# Patient Record
Sex: Female | Born: 1995 | Race: Black or African American | Hispanic: No | Marital: Single | Smoking: Never smoker
Health system: Southern US, Community
[De-identification: ages and names within clinical notes are randomized; demographics above are authoritative.]

## PROBLEM LIST (undated history)

## (undated) HISTORY — PX: KNEE SURGERY: SHX244

---
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
## 2018-06-19 ENCOUNTER — Ambulatory Visit (HOSPITAL_COMMUNITY)
Admission: EM | Admit: 2018-06-19 | Discharge: 2018-06-19 | Disposition: A | Discharge status: Home / Self Care | Payer: Self-pay | Attending: Internal Medicine | Admitting: Internal Medicine

## 2018-06-19 ENCOUNTER — Encounter (HOSPITAL_COMMUNITY): Payer: Self-pay

## 2018-06-19 DIAGNOSIS — H5789 Other specified disorders of eye and adnexa: Secondary | ICD-10-CM

## 2018-06-19 MED ORDER — TETRACAINE HCL 0.5 % OP SOLN
OPHTHALMIC | Status: AC
  Filled 2018-06-19: qty 4

## 2018-06-19 NOTE — ED Triage Notes (Signed)
Pt presents with irritation and pain with right eye since waking up this morning.

## 2018-06-19 NOTE — Discharge Instructions (Addendum)
No danger signs on exam today, vision check was 20/20.  Cause of redness/mild irritation may be something in the air, an irritant like an eyelash, or possibly an early viral infection.  Anticipate gradual improvement in eye redness/irritation over the next couple days.  Cool compresses for 5-10 minutes several times daily, or natural tears type eye drops, may relieve discomfort.  Go to ED if loss of vision or marked increase in eye pain occur.  Recheck or followup with an eye doctor if not improving as expected in a few days.  Note for work today.

## 2018-06-19 NOTE — ED Provider Notes (Signed)
[facility]    CSN: [id number] Arrival date & time: 06/19/18  1338     History   Chief Complaint Chief Complaint  Patient presents with  . Eye Problem    HPI Bonnie Reilly is a 23 y.o. female.   She woke up with some irritation in her right eye, particularly a little discomfort with blinking, this morning.  No mattering, no drainage.  No photophobia.  Eye has also been a little bit itchy.  Discomfort is not severe.  No runny/congested nose, no cough, no sore throat.  No headache.  Does not wear contacts.  No change in vision.  No sick contacts.  Did have an episode of emesis x1 this morning, no abdominal pain, no further emesis. No family history of eye problems reported.   HPI  History reviewed. No pertinent past medical history.   History reviewed. No pertinent surgical history.     Home Medications   Takes no meds regularly  Family History Family History  Problem Relation Age of Onset  . Healthy Mother   . Healthy Father     Social History Social History   Tobacco Use  . Smoking status: Never Smoker  . Smokeless tobacco: Never Used  Substance Use Topics  . Alcohol use: Not on file  . Drug use: Not on file     Allergies   Patient has no known allergies.   Review of Systems Review of Systems  All other systems reviewed and are negative.    Physical Exam Triage Vital Signs ED Triage Vitals  Enc Vitals Group     BP 06/19/18 1402 (!) 143/85     Pulse Rate 06/19/18 1402 73     Resp 06/19/18 1402 20     Temp 06/19/18 1402 98.1 F (36.7 C)     Temp Source 06/19/18 1402 Oral     SpO2 06/19/18 1402 96 %     Weight --      Height --      Pain Score 06/19/18 1403 5     Pain Loc --    Updated Vital Signs BP (!) 143/85 (BP Location: Right Arm)   Pulse 73   Temp 98.1 F (36.7 C) (Oral)   Resp 20   SpO2 96%   Right Eye Near: R Near: 20/20 without corrective lens Left Eye Near:  L Near: 20/20 without corrective lens Bilateral  Near:  20/20 without corrective lens  Physical Exam Vitals signs and nursing note reviewed.  Constitutional:      General: She is not in acute distress.    Comments: Alert, nicely groomed  HENT:     Head: Atraumatic.  Eyes:     Comments: Conjugate gaze, no eye drainage Mild conjunctival injection right lateral conjunctiva No corneal lesion appreciated on penlight or fluorescein exam  Neck:     Musculoskeletal: Neck supple.  Cardiovascular:     Rate and Rhythm: Normal rate.  Pulmonary:     Effort: No respiratory distress.  Abdominal:     General: There is no distension.  Musculoskeletal: Normal range of motion.     Comments: No leg swelling  Skin:    General: Skin is warm and dry.     Comments: No cyanosis  Neurological:     Mental Status: She is alert and oriented to person, place, and time.      Final Clinical Impressions(s) / UC Diagnoses   Final diagnoses:  Eye irritation     Discharge Instructions  No danger signs on exam today, vision check was 20/20.  Cause of redness/mild irritation may be something in the air, an irritant like an eyelash, or possibly an early viral infection.  Anticipate gradual improvement in eye redness/irritation over the next couple days.  Cool compresses for 5-10 minutes several times daily, or natural tears type eye drops, may relieve discomfort.  Go to ED if loss of vision or marked increase in eye pain occur.  Recheck or followup with an eye doctor if not improving as expected in a few days.  Note for work today.   ED Prescriptions    None       Isa Rankin, MD 06/25/18 2038

## 2018-11-04 ENCOUNTER — Other Ambulatory Visit: Payer: Self-pay

## 2018-11-04 ENCOUNTER — Encounter (HOSPITAL_COMMUNITY): Payer: Self-pay | Admitting: Emergency Medicine

## 2018-11-04 ENCOUNTER — Emergency Department (HOSPITAL_COMMUNITY)
Admission: EM | Admit: 2018-11-04 | Discharge: 2018-11-04 | Disposition: A | Discharge status: Home / Self Care | Payer: Managed Care, Other (non HMO) | Attending: Emergency Medicine | Admitting: Emergency Medicine

## 2018-11-04 ENCOUNTER — Emergency Department (HOSPITAL_COMMUNITY): Payer: Managed Care, Other (non HMO)

## 2018-11-04 DIAGNOSIS — M25511 Pain in right shoulder: Secondary | ICD-10-CM

## 2018-11-04 DIAGNOSIS — H1131 Conjunctival hemorrhage, right eye: Secondary | ICD-10-CM | POA: Insufficient documentation

## 2018-11-04 DIAGNOSIS — S01511A Laceration without foreign body of lip, initial encounter: Secondary | ICD-10-CM | POA: Diagnosis not present

## 2018-11-04 DIAGNOSIS — S161XXA Strain of muscle, fascia and tendon at neck level, initial encounter: Secondary | ICD-10-CM | POA: Diagnosis not present

## 2018-11-04 DIAGNOSIS — Y999 Unspecified external cause status: Secondary | ICD-10-CM | POA: Diagnosis not present

## 2018-11-04 DIAGNOSIS — Y9389 Activity, other specified: Secondary | ICD-10-CM | POA: Diagnosis not present

## 2018-11-04 DIAGNOSIS — Y92414 Local residential or business street as the place of occurrence of the external cause: Secondary | ICD-10-CM | POA: Insufficient documentation

## 2018-11-04 DIAGNOSIS — Z23 Encounter for immunization: Secondary | ICD-10-CM | POA: Diagnosis not present

## 2018-11-04 DIAGNOSIS — S4991XA Unspecified injury of right shoulder and upper arm, initial encounter: Secondary | ICD-10-CM | POA: Diagnosis present

## 2018-11-04 MED ORDER — TETANUS-DIPHTH-ACELL PERTUSSIS 5-2.5-18.5 LF-MCG/0.5 IM SUSP
0.5000 mL | Freq: Once | INTRAMUSCULAR | Status: AC
  Administered 2018-11-04: 0.5 mL via INTRAMUSCULAR
  Filled 2018-11-04: qty 0.5

## 2018-11-04 MED ORDER — LIDOCAINE VISCOUS HCL 2 % MT SOLN: 15.0000 mL | OROMUCOSAL | 0 refills | Status: DC | PRN

## 2018-11-04 MED ORDER — IBUPROFEN 400 MG PO TABS
600.0000 mg | ORAL_TABLET | Freq: Once | ORAL | Status: AC
  Administered 2018-11-04: 600 mg via ORAL
  Filled 2018-11-04: qty 1

## 2018-11-04 MED ORDER — METHOCARBAMOL 500 MG PO TABS
500.0000 mg | ORAL_TABLET | Freq: Once | ORAL | Status: AC
  Administered 2018-11-04: 500 mg via ORAL
  Filled 2018-11-04: qty 1

## 2018-11-04 MED ORDER — METHOCARBAMOL 500 MG PO TABS: 500.0000 mg | ORAL_TABLET | Freq: Two times a day (BID) | ORAL | 0 refills | Status: DC

## 2018-11-04 NOTE — ED Notes (Signed)
Patient verbalizes understanding of discharge instructions. Opportunity for questioning and answers were provided. Armband removed by staff, pt discharged from ED.  
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
## 2018-11-10 ENCOUNTER — Emergency Department (HOSPITAL_COMMUNITY)
Admission: EM | Admit: 2018-11-10 | Discharge: 2018-11-10 | Disposition: A | Discharge status: Home / Self Care | Payer: Managed Care, Other (non HMO) | Attending: Emergency Medicine | Admitting: Emergency Medicine

## 2018-11-10 ENCOUNTER — Other Ambulatory Visit: Payer: Self-pay

## 2018-11-10 DIAGNOSIS — G8911 Acute pain due to trauma: Secondary | ICD-10-CM | POA: Insufficient documentation

## 2018-11-10 DIAGNOSIS — M25511 Pain in right shoulder: Secondary | ICD-10-CM | POA: Diagnosis present
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
## 2018-11-10 NOTE — Discharge Instructions (Signed)
You can take Tylenol or Ibuprofen as directed for pain. You can alternate Tylenol and Ibuprofen every 4 hours. If you take Tylenol at 1pm, then you can take Ibuprofen at 5pm. Then you can take Tylenol again at 9pm.   Take Robaxin as prescribed. This medication will make you drowsy so do not drive or drink alcohol when taking it.  Apply ice to help with pain.  Follow-up with [facility] to establish a primary care doctor if you do not have one.   Return to the emergency department for any worsening pain, fevers, redness, swelling or any other worsening concerning symptoms.
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
## 2019-05-18 ENCOUNTER — Other Ambulatory Visit: Payer: Managed Care, Other (non HMO)

## 2020-03-13 ENCOUNTER — Encounter (HOSPITAL_COMMUNITY): Payer: Self-pay | Admitting: Urgent Care

## 2020-03-13 ENCOUNTER — Ambulatory Visit (HOSPITAL_COMMUNITY)
Admission: EM | Admit: 2020-03-13 | Discharge: 2020-03-13 | Disposition: A | Discharge status: Home / Self Care | Payer: HRSA Program | Attending: Urgent Care | Admitting: Urgent Care

## 2020-03-13 ENCOUNTER — Other Ambulatory Visit: Payer: Self-pay

## 2020-03-13 DIAGNOSIS — R0981 Nasal congestion: Secondary | ICD-10-CM | POA: Insufficient documentation

## 2020-03-13 DIAGNOSIS — R519 Headache, unspecified: Secondary | ICD-10-CM | POA: Diagnosis present

## 2020-03-13 DIAGNOSIS — U071 COVID-19: Secondary | ICD-10-CM | POA: Insufficient documentation

## 2020-03-13 NOTE — ED Provider Notes (Signed)
  [facility]   MRN: [id number] DOB: [birth year]  Subjective:   Bonnie Reilly is a 24 y.o. female presenting for 1 day history of frontal headache that lasted through the night and into the morning.  She woke up with sinus congestion as well.  Took Tylenol and this helped.  Denies fever, sinus pain, cough, shortness of breath, chest pain, body aches.  Patient is not Covid vaccinated.  Requires a note for work before returning.  Is scheduled to work again on Monday.  She is not currently taking any medications and has no known food or drug allergies.  Denies past medical and surgical history.   Family History  Problem Relation Age of Onset  . Healthy Mother   . Healthy Father     Social History   Tobacco Use  . Smoking status: Never Smoker  . Smokeless tobacco: Never Used  Substance Use Topics  . Alcohol use: Yes  . Drug use: Never    ROS   Objective:   Vitals: BP (!) 143/97 (BP Location: Right Arm)   Pulse 62   Temp 97.8 F (36.6 C)   LMP 02/25/2020 (Approximate)   SpO2 99%   Physical Exam Constitutional:      General: She is not in acute distress.    Appearance: Normal appearance. She is well-developed. She is not ill-appearing, toxic-appearing or diaphoretic.  HENT:     Head: Normocephalic and atraumatic.     Nose: Nose normal.     Mouth/Throat:     Mouth: Mucous membranes are moist.     Pharynx: Oropharynx is clear.  Eyes:     General: No scleral icterus.    Extraocular Movements: Extraocular movements intact.     Pupils: Pupils are equal, round, and reactive to light.  Cardiovascular:     Rate and Rhythm: Normal rate.  Pulmonary:     Effort: Pulmonary effort is normal.  Skin:    General: Skin is warm and dry.  Neurological:     General: No focal deficit present.     Mental Status: She is alert and oriented to person, place, and time.     Cranial Nerves: No cranial nerve deficit.     Motor: No weakness.     Coordination:  Romberg sign negative. Coordination normal.     Gait: Gait normal.     Deep Tendon Reflexes: Reflexes normal.  Psychiatric:        Mood and Affect: Mood normal. Mood is not anxious or depressed.        Speech: Speech normal.        Behavior: Behavior normal. Behavior is not agitated.        Cognition and Memory: Cognition is not impaired. Memory is not impaired.      Assessment and Plan :   PDMP not reviewed this encounter.  1. Acute nonintractable headache, unspecified headache type   2. Sinus congestion     Suspect patient had a sinus headache.  Given the congestion, will test for COVID-19.  Normal neurologic exam.  Recommend supportive care. Counseled patient on potential for adverse effects with medications prescribed/recommended today, ER and return-to-clinic precautions discussed, patient verbalized understanding.    Wallis Bamberg, [place] 03/13/20 1838

## 2020-03-13 NOTE — ED Triage Notes (Signed)
Pt states she had a headache last night and this morning. She states she called out of work and needs a work note. The headache subsided after taking tylenol.

## 2020-03-14 LAB — SARS CORONAVIRUS 2 (TAT 6-24 HRS): SARS Coronavirus 2: POSITIVE — AB

## 2020-03-15 ENCOUNTER — Telehealth: Payer: Self-pay | Admitting: Physician Assistant

## 2020-03-15 ENCOUNTER — Telehealth: Payer: Self-pay | Admitting: Unknown Physician Specialty

## 2020-03-15 ENCOUNTER — Telehealth: Payer: Self-pay | Admitting: Nurse Practitioner

## 2020-03-15 NOTE — Telephone Encounter (Signed)
Called to Discuss with patient about Covid symptoms and the use of the monoclonal antibody infusion for those with mild to moderate Covid symptoms and at a high risk of hospitalization.     Pt appears to qualify for this infusion due to co-morbid conditions and/or a member of an at-risk group in accordance with the FDA Emergency Use Authorization. Patient declines infusion, stating she feels much better since ED visit and no other symptoms. Advised if symptoms worsened or she wished to be considered for infusion to contact the infusion center. Pt verbalized understanding. Last eligible date is 03/22/20.   Bonnie Alma, NP [facility]  [phone]

## 2020-03-15 NOTE — Telephone Encounter (Signed)
Called to Discuss with patient about Covid symptoms and the use of the monoclonal antibody infusion for those with mild to moderate Covid symptoms and at a high risk of hospitalization.     Pt appears to qualify for this infusion due to co-morbid conditions and/or a member of an at-risk group in accordance with the FDA Emergency Use Authorization.    Unable to reach pt. Left voice mail and sent mychart message.   

## 2020-03-15 NOTE — Telephone Encounter (Signed)
Call #2- Called to Discuss with patient about Covid symptoms and the use of the monoclonal antibody infusion for those with mild to moderate Covid symptoms and at a high risk of hospitalization.     Pt appears to qualify for this infusion due to co-morbid conditions and/or a member of an at-risk group in accordance with the FDA Emergency Use Authorization.    Unable to reach pt  LMOM 

## 2020-03-19 ENCOUNTER — Other Ambulatory Visit: Payer: Self-pay

## 2020-03-19 ENCOUNTER — Emergency Department (HOSPITAL_COMMUNITY): Payer: HRSA Program

## 2020-03-19 ENCOUNTER — Encounter (HOSPITAL_COMMUNITY): Payer: Self-pay

## 2020-03-19 ENCOUNTER — Emergency Department (HOSPITAL_COMMUNITY)
Admission: EM | Admit: 2020-03-19 | Discharge: 2020-03-19 | Disposition: A | Discharge status: Home / Self Care | Payer: HRSA Program | Attending: Emergency Medicine | Admitting: Emergency Medicine

## 2020-03-19 DIAGNOSIS — U071 COVID-19: Secondary | ICD-10-CM | POA: Diagnosis not present

## 2020-03-19 DIAGNOSIS — J1282 Pneumonia due to coronavirus disease 2019: Secondary | ICD-10-CM | POA: Insufficient documentation

## 2020-03-19 DIAGNOSIS — R0602 Shortness of breath: Secondary | ICD-10-CM | POA: Diagnosis present

## 2020-03-19 NOTE — ED Provider Notes (Signed)
[facility]-EMERGENCY DEPT Provider Note   CSN: [id number] Arrival date & time: 03/19/20  1241     History Chief Complaint  Patient presents with  . Covid Positive  . Shortness of Breath    Bonnie Reilly is a 24 y.o. female.  She has no significant past medical history.  She said she had a cough and shortness of breath starting this weekend and tested positive for Covid on Monday.  She became more short of breath since last night, needing to rest in the shower or climbing a flight of stairs.  Cough productive of some white sputum.  Some headaches responsive to Tylenol.  No diarrhea.  She is not Covid vaccinated.  The history is provided by the patient.  Shortness of Breath Severity:  Moderate Onset quality:  Gradual Timing:  Intermittent Progression:  Unchanged Chronicity:  New Context: activity   Relieved by:  Rest Worsened by:  Activity and coughing Ineffective treatments:  None tried Associated symptoms: cough and headaches   Associated symptoms: no abdominal pain, no chest pain, no fever, no hemoptysis, no rash, no sore throat and no vomiting   Risk factors: no tobacco use        History reviewed. No pertinent past medical history.  There are no problems to display for this patient.   History reviewed. No pertinent surgical history.   OB History   No obstetric history on file.     Family History  Problem Relation Age of Onset  . Healthy Mother   . Healthy Father     Social History   Tobacco Use  . Smoking status: Never Smoker  . Smokeless tobacco: Never Used  Substance Use Topics  . Alcohol use: Yes  . Drug use: Never    Home Medications Prior to Admission medications   Medication Sig Start Date End Date Taking? Authorizing Provider  lidocaine (XYLOCAINE) 2 % solution Use as directed 15 mLs in the mouth or throat every 3 (three) hours as needed for mouth pain. 11/04/18   Debord, Mia A, PA-C  methocarbamol (ROBAXIN) 500  MG tablet Take 1 tablet (500 mg total) by mouth 2 (two) times daily. 11/04/18   Baisch, Mia A, PA-C    Allergies    Patient has no known allergies.  Review of Systems   Review of Systems  Constitutional: Negative for fever.  HENT: Negative for sore throat.   Eyes: Negative for visual disturbance.  Respiratory: Positive for cough and shortness of breath. Negative for hemoptysis.   Cardiovascular: Negative for chest pain.  Gastrointestinal: Negative for abdominal pain and vomiting.  Genitourinary: Negative for dysuria.  Musculoskeletal: Positive for myalgias.  Skin: Negative for rash.  Neurological: Positive for headaches.    Physical Exam Updated Vital Signs BP 107/79   Pulse 61   Temp 98.1 F (36.7 C) (Oral)   Resp 16   LMP 02/25/2020 (Approximate)   SpO2 99%   Physical Exam Vitals and nursing note reviewed.  Constitutional:      General: She is not in acute distress.    Appearance: She is well-developed.  HENT:     Head: Normocephalic and atraumatic.  Eyes:     Conjunctiva/sclera: Conjunctivae normal.  Cardiovascular:     Rate and Rhythm: Normal rate and regular rhythm.     Heart sounds: No murmur heard.   Pulmonary:     Effort: Pulmonary effort is normal. No respiratory distress.     Breath sounds: Normal breath sounds.  Abdominal:  Palpations: Abdomen is soft.     Tenderness: There is no abdominal tenderness.  Musculoskeletal:        General: Normal range of motion.     Cervical back: Neck supple.     Right lower leg: No tenderness.     Left lower leg: No tenderness.  Skin:    General: Skin is warm and dry.     Capillary Refill: Capillary refill takes less than 2 seconds.  Neurological:     General: No focal deficit present.     Mental Status: She is alert.     ED Results / Procedures / Treatments   Labs (all labs ordered are listed, but only abnormal results are displayed) Labs Reviewed - No data to display  EKG EKG  Interpretation  Date/Time:  Wednesday March 19 2020 12:57:35 EST Ventricular Rate:  83 PR Interval:    QRS Duration: 80 QT Interval:  373 QTC Calculation: 439 R Axis:   60 Text Interpretation: Sinus rhythm 12 Lead; Mason-Likar No old tracing to compare Confirmed by Meridee Score [phone]) on 03/19/2020 3:40:09 PM   Radiology DG Chest Portable 1 View  Result Date: 03/19/2020 CLINICAL DATA:  Shortness of breath.  History of COVID-19 infection. EXAM: PORTABLE CHEST 1 VIEW COMPARISON:  None. FINDINGS: Single view of the chest demonstrates low lung volumes. Hazy densities in the right lower lung. Heart size is within normal limits. Trachea is midline. Negative for a pneumothorax. IMPRESSION: Low lung volumes with hazy densities in the right lower lung. Differential would include atelectasis versus atypical infection such as COVID pneumonia. Electronically Signed   By: Richarda Overlie M.D.   On: 03/19/2020 13:30    Procedures Procedures (including critical care time)  Medications Ordered in ED Medications - No data to display  ED Course  I have reviewed the triage vital signs and the nursing notes.  Pertinent labs & imaging results that were available during my care of the patient were reviewed by me and considered in my medical decision making (see chart for details).    MDM Rules/Calculators/A&P                         ARRION BURRUEL was evaluated in Emergency Department on 03/19/2020 for the symptoms described in the history of present illness. She was evaluated in the context of the global COVID-19 pandemic, which necessitated consideration that the patient might be at risk for infection with the SARS-CoV-2 virus that causes COVID-19. Institutional protocols and algorithms that pertain to the evaluation of patients at risk for COVID-19 are in a state of rapid change based on information released by regulatory bodies including the CDC and federal and state organizations. These  policies and algorithms were followed during the patient's care in the ED.  Patient here with Covid and increased shortness of breath.  Chest x-ray ordered interpreted by me as multifocal pneumonia consistent with viral process including Covid.  EKG unremarkable.  Patient is trending pulse ox remains mid to high 90s.  She does not require acute admission to the hospital.  I reached out to the infusion center and they will reach out to the patient for follow-up return instructions discussed.  Differential includes Covid, pneumonia, pneumothorax, PE    Final Clinical Impression(s) / ED Diagnoses Final diagnoses:  Pneumonia due to COVID-19 virus    Rx / DC Orders ED Discharge Orders    None       Terrilee Files, MD  03/19/20 1859  

## 2020-03-19 NOTE — Discharge Instructions (Addendum)
You were seen in the emergency department for worsening shortness of breath in the setting of a recent Covid diagnosis.  Your x-ray shows signs of Covid pneumonia.  Fortunately your oxygen number has been good.  The infusion center will reach out to you if you are a candidate for monoclonal antibodies.  There is also a Covid clinic that you can follow-up then if you feel your symptoms are worsening.  If you feel extremely short of breath or any other concerning symptoms please return to the emergency department.  You will need to isolate for up to 14 days from when your symptoms started.

## 2020-03-20 ENCOUNTER — Telehealth: Payer: Self-pay | Admitting: Nurse Practitioner

## 2020-03-20 NOTE — Telephone Encounter (Signed)
Called to Discuss with patient about Covid symptoms and the use of the monoclonal antibody infusion for those with mild to moderate Covid symptoms and at a high risk of hospitalization.     Pt appears to qualify for this infusion due to co-morbid conditions and/or a member of an at-risk group in accordance with the FDA Emergency Use Authorization.   Patient declined infusion last week however, it appears that symptoms have worsened. Unable to reach. Voicemail left and voicemail sent.   Willette Alma, NP [facility]  [phone]

## 2020-04-09 ENCOUNTER — Ambulatory Visit (HOSPITAL_COMMUNITY)
Admission: EM | Admit: 2020-04-09 | Discharge: 2020-04-09 | Disposition: A | Discharge status: Home / Self Care | Payer: Self-pay | Attending: Student | Admitting: Student

## 2020-04-09 ENCOUNTER — Ambulatory Visit (INDEPENDENT_AMBULATORY_CARE_PROVIDER_SITE_OTHER): Payer: Self-pay

## 2020-04-09 ENCOUNTER — Other Ambulatory Visit: Payer: Self-pay

## 2020-04-09 ENCOUNTER — Encounter (HOSPITAL_COMMUNITY): Payer: Self-pay | Admitting: *Deleted

## 2020-04-09 DIAGNOSIS — M25561 Pain in right knee: Secondary | ICD-10-CM

## 2020-04-09 NOTE — ED Triage Notes (Signed)
Patient reports right knee pain since Sunday night. Reports history of knee surgery to left knee. No injury on Sunday to knee, states that she had hurt it a couple months ago but not recently.   Reports pain with ambulation and extension.

## 2020-04-09 NOTE — ED Provider Notes (Addendum)
[facility]    CSN: [id number] Arrival date & time: 04/09/20  2683      History   Chief Complaint Chief Complaint  Patient presents with  . Knee Pain    HPI Bonnie Reilly is a 24 y.o. female presenting for right knee pain for 3 days. Denies injury at that time, though she did fall on the knee several months ago. States that the knee improved on its own, but she's been working a lot lately, involving lots of standing and walking; this seems to have aggravated the right knee. She describes the pain as a sharp pain on the front lower part of the knee. Patient has noticed the knee swelling, which alarmed her. She is able to ambulate with pain. Denies other symptoms, denies fevers/chills. Denies hip pain. Denies pain in either hip. She notes distant history of left knee surgery when she was about 11; she cannot remember what this was for.   HPI   History reviewed. No pertinent past medical history.  There are no problems to display for this patient.   History reviewed. No pertinent surgical history.  OB History   No obstetric history on file.      Home Medications    Prior to Admission medications   Medication Sig Start Date End Date Taking? Authorizing Provider  lidocaine (XYLOCAINE) 2 % solution Use as directed 15 mLs in the mouth or throat every 3 (three) hours as needed for mouth pain. 11/04/18   Beddow, Mia A, PA-C  methocarbamol (ROBAXIN) 500 MG tablet Take 1 tablet (500 mg total) by mouth 2 (two) times daily. 11/04/18   Smaltz, Mia A, PA-C    Family History Family History  Problem Relation Age of Onset  . Healthy Mother   . Healthy Father     Social History Social History   Tobacco Use  . Smoking status: Never Smoker  . Smokeless tobacco: Never Used  Substance Use Topics  . Alcohol use: Yes  . Drug use: Never     Allergies   Patient has no known allergies.   Review of Systems Review of Systems  Constitutional: Positive for activity  change (standing a lot). Negative for chills and fever.  Musculoskeletal: Positive for joint swelling (right knee pain ). Negative for arthralgias and back pain.  All other systems reviewed and are negative.    Physical Exam Triage Vital Signs ED Triage Vitals  Enc Vitals Group     BP 04/09/20 1047 138/85     Pulse Rate 04/09/20 1047 70     Resp 04/09/20 1047 16     Temp 04/09/20 1047 98 F (36.7 C)     Temp Source 04/09/20 1047 Oral     SpO2 04/09/20 1047 97 %     Weight --      Height --      Head Circumference --      Peak Flow --      Pain Score 04/09/20 1046 6     Pain Loc --      Pain Edu? --      Excl. in GC? --    No data found.  Updated Vital Signs BP 138/85 (BP Location: Left Arm)   Pulse 70   Temp 98 F (36.7 C) (Oral)   Resp 16   SpO2 97%   Visual Acuity Right Eye Distance:   Left Eye Distance:   Bilateral Distance:    Right Eye Near:   Left Eye Near:  Bilateral Near:     Physical Exam Vitals reviewed.  Constitutional:      General: She is not in acute distress.    Appearance: She is obese.  HENT:     Head: Normocephalic and atraumatic.  Cardiovascular:     Pulses: Normal pulses.  Musculoskeletal:        General: Swelling (right knee effusion ) and tenderness (right knee tender to palpation on distal medial aspect of patella) present. No deformity or signs of injury. Normal range of motion.     Right hip: Normal. No deformity, tenderness or bony tenderness. Normal range of motion. Normal strength.     Left hip: Normal. No deformity, tenderness or bony tenderness. Normal range of motion. Normal strength.     Right knee: Swelling and effusion present. No deformity, erythema, ecchymosis, lacerations or crepitus. Normal range of motion. Tenderness present. Normal pulse.     Instability Tests: Anterior drawer test negative. Posterior drawer test negative. Medial McMurray test negative and lateral McMurray test negative.     Left knee: Normal. No  swelling, deformity, effusion, erythema, ecchymosis, lacerations or crepitus. Normal range of motion. No tenderness. Normal pulse.     Instability Tests: Anterior drawer test negative. Posterior drawer test negative. Medial McMurray test negative and lateral McMurray test negative.     Right lower leg: No edema.     Left lower leg: No edema.     Comments: Left knee with faint surgical scar.   Gait normal but patient favors right leg.   Neurological:     Mental Status: She is alert.     Comments: LEs are neurovascularly intact      UC Treatments / Results  Labs (all labs ordered are listed, but only abnormal results are displayed) Labs Reviewed - No data to display  EKG   Radiology DG Knee AP/LAT W/Sunrise Right  Result Date: 04/09/2020 CLINICAL DATA:  Right knee pain EXAM: RIGHT KNEE 3 VIEWS COMPARISON:  None. FINDINGS: No acute bony abnormality. Specifically, no fracture, subluxation, or dislocation. Joint spaces maintained. No joint effusion. IMPRESSION: Negative. Electronically Signed   By: Charlett Nose M.D.   On: 04/09/2020 11:46    Procedures Procedures (including critical care time)  Medications Ordered in UC Medications - No data to display  Initial Impression / Assessment and Plan / UC Course  I have reviewed the triage vital signs and the nursing notes.  Pertinent labs & imaging results that were available during my care of the patient were reviewed by me and considered in my medical decision making (see chart for details).  Clinical Course as of Apr 09 1158  Wed Apr 09, 2020  1126 DG Knee AP/LAT W/Sunrise Right [LG]    Clinical Course User Index [LG] Rhys Martini, PA-C   Right knee Xray negative today. Reassurance provided. Rec NSAIDs for 4-6 weeks, and limit extensive standing/walking at work as possible. Follow-up with sports medicine if no improvement. Information provided. Patient is in agreement with the above plan.    Final Clinical Impressions(s) /  UC Diagnoses   Final diagnoses:  Acute pain of right knee   Discharge Instructions   None    ED Prescriptions    None     PDMP not reviewed this encounter.   Rhys Martini, PA-C 04/09/20 1200    Rhys Martini, PA-C 04/09/20 1217

## 2020-06-25 ENCOUNTER — Encounter (HOSPITAL_COMMUNITY): Payer: Self-pay

## 2020-06-25 ENCOUNTER — Ambulatory Visit (HOSPITAL_COMMUNITY)
Admission: RE | Admit: 2020-06-25 | Discharge: 2020-06-25 | Disposition: A | Discharge status: Home / Self Care | Payer: Medicaid Other | Source: Ambulatory Visit | Attending: Family Medicine | Admitting: Family Medicine

## 2020-06-25 ENCOUNTER — Other Ambulatory Visit: Payer: Self-pay

## 2020-06-25 VITALS — BP 134/88 | HR 82 | Temp 98.1°F | Resp 16

## 2020-06-25 DIAGNOSIS — J029 Acute pharyngitis, unspecified: Secondary | ICD-10-CM | POA: Insufficient documentation

## 2020-06-25 LAB — POCT RAPID STREP A, ED / UC: Streptococcus, Group A Screen (Direct): NEGATIVE

## 2020-06-25 NOTE — ED Triage Notes (Signed)
Pt presents with a sore throat X 2 days. Pt denies fever.

## 2020-06-25 NOTE — Discharge Instructions (Addendum)
You may use over the counter ibuprofen or acetaminophen as needed.  For a sore throat, over the counter products such as Colgate Peroxyl Mouth Sore Rinse or Chloraseptic Sore Throat Spray may provide some temporary relief. Your rapid strep test was negative today. We have sent your throat swab for culture and will let you know of any positive results. 

## 2020-06-25 NOTE — ED Provider Notes (Signed)
  [facility]   [id number] 06/25/20 Arrival Time: 1306  ASSESSMENT & PLAN:  1. Sore throat     No signs of peritonsillar abscess. Discussed. Throat culture sent. Declines COVID testing.  Results for orders placed or performed during the hospital encounter of 06/25/20  POCT Rapid Strep A  Result Value Ref Range   Streptococcus, Group A Screen (Direct) NEGATIVE NEGATIVE   Labs Reviewed  CULTURE, GROUP A STREP [facility])  POCT RAPID STREP A, ED / UC    OTC analgesics and throat care as needed   Discharge Instructions      You may use over the counter ibuprofen or acetaminophen as needed.   For a sore throat, over the counter products such as [employer]gate Peroxyl Mouth Sore Rinse or Chloraseptic Sore Throat Spray may provide some temporary relief.  Your rapid strep test was negative today. We have sent your throat swab for culture and will let you know of any positive results.     Reviewed expectations re: course of current medical issues. Questions answered. Outlined signs and symptoms indicating need for more acute intervention. Patient verbalized understanding. After Visit Summary given.   SUBJECTIVE:  Bonnie Reilly is a 25 y.o. female who reports a sore throat. First noted approx 2 d ago; stable. Painful swallowing. Afebrile. No resp symptoms. No OTC tx.   OBJECTIVE:  Vitals:   06/25/20 1340  BP: 134/88  Pulse: 82  Resp: 16  Temp: 98.1 F (36.7 C)  TempSrc: Oral  SpO2: 100%     General appearance: alert; no distress HEENT: throat with moderate erythema and cobblestoning; uvula is midline Neck: supple with FROM; no lymphadenopathy Lungs: speaks full sentences without difficulty; unlabored Abd: soft; non-tender Skin: reveals no rash; warm and dry Psychological: alert and cooperative; normal mood and affect  No Known Allergies  History reviewed. No pertinent past medical history. Social History   Socioeconomic History  . Marital status:  Single    Spouse name: Not on file  . Number of children: Not on file  . Years of education: Not on file  . Highest education level: Not on file  Occupational History  . Not on file  Tobacco Use  . Smoking status: Never Smoker  . Smokeless tobacco: Never Used  Substance and Sexual Activity  . Alcohol use: Yes  . Drug use: Never  . Sexual activity: Not on file  Other Topics Concern  . Not on file  Social History Narrative  . Not on file   Social Determinants of Health   Financial Resource Strain: Not on file  Food Insecurity: Not on file  Transportation Needs: Not on file  Physical Activity: Not on file  Stress: Not on file  Social Connections: Not on file  Intimate Partner Violence: Not on file   Family History  Problem Relation Age of Onset  . Healthy Mother   . Healthy Father           Mardella Layman, MD 06/25/20 [phone]

## 2020-06-26 LAB — CULTURE, GROUP A STREP (THRC)

## 2020-06-27 LAB — CULTURE, GROUP A STREP (THRC)

## 2020-07-07 ENCOUNTER — Other Ambulatory Visit: Payer: Self-pay

## 2020-07-07 ENCOUNTER — Encounter (HOSPITAL_COMMUNITY): Payer: Self-pay | Admitting: *Deleted

## 2020-07-07 ENCOUNTER — Ambulatory Visit (HOSPITAL_COMMUNITY)
Admission: EM | Admit: 2020-07-07 | Discharge: 2020-07-07 | Disposition: A | Discharge status: Home / Self Care | Payer: Medicaid Other | Attending: Emergency Medicine | Admitting: Emergency Medicine

## 2020-07-07 DIAGNOSIS — R112 Nausea with vomiting, unspecified: Secondary | ICD-10-CM

## 2020-07-07 DIAGNOSIS — R197 Diarrhea, unspecified: Secondary | ICD-10-CM

## 2020-07-07 MED ORDER — ONDANSETRON 4 MG PO TBDP: 4.0000 mg | ORAL_TABLET | Freq: Once | ORAL | Status: DC

## 2020-07-07 MED ORDER — ONDANSETRON 4 MG PO TBDP: 4.0000 mg | ORAL_TABLET | Freq: Three times a day (TID) | ORAL | 0 refills | Status: DC | PRN

## 2020-07-07 NOTE — Discharge Instructions (Signed)
Zofran every 8 hours as needed for nausea or vomiting.   Small frequent sips of fluids- Pedialyte, Gatorade, water, broth- to maintain hydration.   Advance to bland diet as tolerated.  If symptoms worsen or do not improve in the next week to return to be seen or to follow up with your PCP.

## 2020-07-07 NOTE — ED Triage Notes (Signed)
Pt presents with vomiting undigested food x2 today. No other Sx's.

## 2020-07-07 NOTE — ED Provider Notes (Signed)
[facility]    CSN: [id number] Arrival date & time: 07/07/20  1554      History   Chief Complaint Chief Complaint  Patient presents with  . Emesis    HPI Bonnie Reilly is a 25 y.o. female.   Bonnie Reilly presents with complaints of two episodes of vomiting, as well as diarrhea. She ate burgers she grilled, yesterday.  A few hours later noted nausea, followed by vomiting. She vomited once last night and once this morning. Still with some nausea. Abdominal pain prior to emesis or diarrhea, but otherwise no abdominal pain. No fevers. No other URI symptoms. No known ill contacts. She did have covid-19 in November of 2021. Normal urination. She is not sexually active. LMP 06/19/2020   ROS per HPI, negative if not otherwise mentioned.      History reviewed. No pertinent past medical history.  There are no problems to display for this patient.   History reviewed. No pertinent surgical history.  OB History   No obstetric history on file.      Home Medications    Prior to Admission medications   Medication Sig Start Date End Date Taking? Authorizing Provider  ondansetron (ZOFRAN-ODT) 4 MG disintegrating tablet Take 1 tablet (4 mg total) by mouth every 8 (eight) hours as needed for nausea or vomiting. 07/07/20  Yes Asiya Cutbirth, Dorene Grebe B, NP  lidocaine (XYLOCAINE) 2 % solution Use as directed 15 mLs in the mouth or throat every 3 (three) hours as needed for mouth pain. 11/04/18   Witting, Mia A, PA-C  methocarbamol (ROBAXIN) 500 MG tablet Take 1 tablet (500 mg total) by mouth 2 (two) times daily. 11/04/18   Burmester, Mia A, PA-C    Family History Family History  Problem Relation Age of Onset  . Healthy Mother   . Healthy Father     Social History Social History   Tobacco Use  . Smoking status: Never Smoker  . Smokeless tobacco: Never Used  Substance Use Topics  . Alcohol use: Yes  . Drug use: Never     Allergies   Patient has no known  allergies.   Review of Systems Review of Systems   Physical Exam Triage Vital Signs ED Triage Vitals  Enc Vitals Group     BP 07/07/20 1624 (!) 149/106     Pulse Rate 07/07/20 1624 96     Resp 07/07/20 1624 20     Temp 07/07/20 1624 98.3 F (36.8 C)     Temp Source 07/07/20 1624 Oral     SpO2 07/07/20 1624 99 %     Weight --      Height --      Head Circumference --      Peak Flow --      Pain Score 07/07/20 1622 0     Pain Loc --      Pain Edu? --      Excl. in GC? --    No data found.  Updated Vital Signs BP (!) 149/106 (BP Location: Right Arm)   Pulse 96   Temp 98.3 F (36.8 C) (Oral)   Resp 20   LMP 06/09/2020   SpO2 99%   Visual Acuity Right Eye Distance:   Left Eye Distance:   Bilateral Distance:    Right Eye Near:   Left Eye Near:    Bilateral Near:     Physical Exam Constitutional:      General: She is not in acute distress.  Appearance: She is well-developed.  Cardiovascular:     Rate and Rhythm: Normal rate.  Pulmonary:     Effort: Pulmonary effort is normal.  Abdominal:     Tenderness: There is no abdominal tenderness.  Skin:    General: Skin is warm and dry.  Neurological:     Mental Status: She is alert and oriented to person, place, and time.      UC Treatments / Results  Labs (all labs ordered are listed, but only abnormal results are displayed) Labs Reviewed  SARS CORONAVIRUS 2 (TAT 6-24 HRS)    EKG   Radiology No results found.  Procedures Procedures (including critical care time)  Medications Ordered in UC Medications  ondansetron (ZOFRAN-ODT) disintegrating tablet 4 mg (has no administration in time range)    Initial Impression / Assessment and Plan / UC Course  I have reviewed the triage vital signs and the nursing notes.  Pertinent labs & imaging results that were available during my care of the patient were reviewed by me and considered in my medical decision making (see chart for details).     Likely  related to the burgers she grilled last night. Supportive cares recommended. covid testing also collected prior to return to work. Return precautions provided. Patient verbalized understanding and agreeable to plan.   Final Clinical Impressions(s) / UC Diagnoses   Final diagnoses:  Nausea vomiting and diarrhea     Discharge Instructions     Zofran every 8 hours as needed for nausea or vomiting.   Small frequent sips of fluids- Pedialyte, Gatorade, water, broth- to maintain hydration.   Advance to bland diet as tolerated.  If symptoms worsen or do not improve in the next week to return to be seen or to follow up with your PCP.     ED Prescriptions    Medication Sig Dispense Auth. Provider   ondansetron (ZOFRAN-ODT) 4 MG disintegrating tablet Take 1 tablet (4 mg total) by mouth every 8 (eight) hours as needed for nausea or vomiting. 12 tablet Georgetta Haber, NP     PDMP not reviewed this encounter.   Georgetta Haber, NP 07/07/20 1719

## 2020-12-09 ENCOUNTER — Other Ambulatory Visit: Payer: Self-pay

## 2020-12-09 ENCOUNTER — Encounter (HOSPITAL_COMMUNITY): Payer: Self-pay

## 2020-12-09 ENCOUNTER — Ambulatory Visit (HOSPITAL_COMMUNITY)
Admission: EM | Admit: 2020-12-09 | Discharge: 2020-12-09 | Disposition: A | Discharge status: Home / Self Care | Payer: BC Managed Care – PPO | Attending: Family Medicine | Admitting: Family Medicine

## 2020-12-09 DIAGNOSIS — M25561 Pain in right knee: Secondary | ICD-10-CM

## 2020-12-09 DIAGNOSIS — G8929 Other chronic pain: Secondary | ICD-10-CM

## 2020-12-09 MED ORDER — MELOXICAM 15 MG PO TABS: 15.0000 mg | ORAL_TABLET | Freq: Every day | ORAL | 0 refills | Status: DC

## 2020-12-09 NOTE — Discharge Instructions (Addendum)
Your knee exam is most consistent with an injury to your right medial meniscus.  We will refer you to orthopedics for further evaluation.  You should work on strengthening of your quad muscles as I showed you, rest, ice the area, and I will also send you a prescription for an anti-inflammatory medication that you can use for pain.  You should not take this with any other anti-inflammatories such as Advil, Aleve, ibuprofen.  You can continue to wear your brace at work.  We will write you for light duty for 1 week.

## 2020-12-09 NOTE — ED Provider Notes (Signed)
[facility]    CSN: [id number] Arrival date & time: 12/09/20  1530      History   Chief Complaint Chief Complaint  Patient presents with   Knee Pain    HPI Bonnie Reilly is a 25 y.o. female.   Right Medial Knee pain Patient reports that for the last year she has been having right medial knee pain off and on She reports that this episode has been occurring for the last week This episode started suddenly when going from sitting to standing Started 1 year ago after a fall onto her right knee She was previously evaluated for this and last had imaging in urgent care on 04/10/2019 one of her right knee was unremarkable She has not tried formal physical therapy or home exercises, she has gotten an over-the-counter brace and wears at work She states that she works at [employer] and has pain after standing on her feet for a long period of time She also reports occasionally taking Tylenol or ibuprofen as needed for the pain, but does not improve She reports that going down stairs makes the pain more She does feel that her knee is unstable at times, but has never completely giving out causing her to fall She does feel some locking at times as well Occasionally has some swelling of her knee Otherwise feeling well, no fevers, no hip pain   History reviewed. No pertinent past medical history.  There are no problems to display for this patient.   History reviewed. No pertinent surgical history.  OB History   No obstetric history on file.      Home Medications    Prior to Admission medications   Medication Sig Start Date End Date Taking? Authorizing Provider  meloxicam (MOBIC) 15 MG tablet Take 1 tablet (15 mg total) by mouth daily. 12/09/20  Yes Harsha Yusko, Solmon Ice, DO  lidocaine (XYLOCAINE) 2 % solution Use as directed 15 mLs in the mouth or throat every 3 (three) hours as needed for mouth pain. 11/04/18   Kong, Mia A, PA-C  methocarbamol (ROBAXIN) 500 MG  tablet Take 1 tablet (500 mg total) by mouth 2 (two) times daily. 11/04/18   Behe, Mia A, PA-C  ondansetron (ZOFRAN-ODT) 4 MG disintegrating tablet Take 1 tablet (4 mg total) by mouth every 8 (eight) hours as needed for nausea or vomiting. 07/07/20   Georgetta Haber, NP    Family History Family History  Problem Relation Age of Onset   Healthy Mother    Healthy Father     Social History Social History   Tobacco Use   Smoking status: Never   Smokeless tobacco: Never  Substance Use Topics   Alcohol use: Yes   Drug use: Never     Allergies   Patient has no known allergies.   Review of Systems Review of Systems  Constitutional:  Negative for fever.  HENT:  Negative for congestion.   Respiratory:  Negative for shortness of breath.   Cardiovascular:  Negative for chest pain.  Gastrointestinal:  Negative for abdominal pain.  Musculoskeletal:        Right knee pain  Skin:  Negative for wound.  Neurological:  Negative for weakness.    Physical Exam Triage Vital Signs ED Triage Vitals [12/09/20 1602]  Enc Vitals Group     BP 134/76     Pulse Rate 66     Resp 18     Temp 98.7 F (37.1 C)  Temp Source Oral     SpO2 97 %     Weight      Height      Head Circumference      Peak Flow      Pain Score      Pain Loc      Pain Edu?      Excl. in GC?    No data found.  Updated Vital Signs BP 134/76 (BP Location: Right Arm)   Pulse 66   Temp 98.7 F (37.1 C) (Oral)   Resp 18   SpO2 97%   Visual Acuity Right Eye Distance:   Left Eye Distance:   Bilateral Distance:    Right Eye Near:   Left Eye Near:    Bilateral Near:     Physical Exam Constitutional:      General: She is not in acute distress.    Appearance: Normal appearance. She is not ill-appearing or toxic-appearing.  HENT:     Head: Normocephalic and atraumatic.  Cardiovascular:     Rate and Rhythm: Normal rate.  Pulmonary:     Effort: Pulmonary effort is normal.  Musculoskeletal:      Comments: Right Knee: - Inspection: no gross deformity b/l. No swelling/effusion, erythema or bruising b/l. Skin intact - Palpation: TTP right medial joint line - ROM: full active ROM with flexion and extension in knee and hip b/l - Strength: 5/5 strength b/l - Neuro/vasc: NV intact distally b/l - Special Tests: - LIGAMENTS: negative anterior and posterior drawer, negative Lachman's, no MCL or LCL laxity  -- MENISCUS: positive McMurray's on right, positive Thessaly  -- PF JOINT: nml patellar mobility bilaterally.  negative patellar grind, negative patellar apprehension  Hips: normal ROM   Skin:    General: Skin is warm and dry.  Neurological:     Mental Status: She is alert and oriented to person, place, and time.     UC Treatments / Results  Labs (all labs ordered are listed, but only abnormal results are displayed) Labs Reviewed - No data to display  EKG   Radiology No results found.  Procedures Procedures (including critical care time)  Medications Ordered in UC Medications - No data to display  Initial Impression / Assessment and Plan / UC Course  I have reviewed the triage vital signs and the nursing notes.  Pertinent labs & imaging results that were available during my care of the patient were reviewed by me and considered in my medical decision making (see chart for details).     Patient is a 25 year old female with no significant past medical history who presents with right medial knee pain for the last week, acute on chronic for the last year.  Her exam and symptoms are most consistent with an injury to her medial meniscus.  She has had no injury since her x-rays in December, no need to repeat at this time.  Discussed with patient that given her continued pain, will refer her to orthopedics for formal evaluation and possible further imaging.  In the meantime, patient will be given light duty work to avoid excessive standing or bending.  She will use this for 1  week.  We will also sent a prescription for meloxicam to take daily for her pain.  She can continue to ice and wear her brace while she is at work.  Also advised to work on quad strengthening to help support her knee.  She will follow-up with orthopedics.  She was discharged home in  stable condition.   Final Clinical Impressions(s) / UC Diagnoses   Final diagnoses:  Chronic pain of right knee     Discharge Instructions      Your knee exam is most consistent with an injury to your right medial meniscus.  We will refer you to orthopedics for further evaluation.  You should work on strengthening of your quad muscles as I showed you, rest, ice the area, and I will also send you a prescription for an anti-inflammatory medication that you can use for pain.  You should not take this with any other anti-inflammatories such as Advil, Aleve, ibuprofen.  You can continue to wear your brace at work.  We will write you for light duty for 1 week.     ED Prescriptions     Medication Sig Dispense Auth. Provider   meloxicam (MOBIC) 15 MG tablet Take 1 tablet (15 mg total) by mouth daily. 30 tablet Witney Huie, Solmon Ice, DO      PDMP not reviewed this encounter.   Audreena Sachdeva, Solmon Ice, DO 12/09/20 1629

## 2020-12-09 NOTE — ED Triage Notes (Signed)
Pt presents with right knee pain X 1 week non injury related.

## 2021-01-06 ENCOUNTER — Ambulatory Visit (HOSPITAL_COMMUNITY)
Admission: EM | Admit: 2021-01-06 | Discharge: 2021-01-06 | Disposition: A | Discharge status: Home / Self Care | Payer: BC Managed Care – PPO | Attending: Internal Medicine | Admitting: Internal Medicine

## 2021-01-06 ENCOUNTER — Other Ambulatory Visit: Payer: Self-pay

## 2021-01-06 ENCOUNTER — Encounter (HOSPITAL_COMMUNITY): Payer: Self-pay | Admitting: Emergency Medicine

## 2021-01-06 DIAGNOSIS — Z20822 Contact with and (suspected) exposure to covid-19: Secondary | ICD-10-CM | POA: Insufficient documentation

## 2021-01-06 DIAGNOSIS — R1111 Vomiting without nausea: Secondary | ICD-10-CM | POA: Insufficient documentation

## 2021-01-06 NOTE — Discharge Instructions (Signed)
I suspect you have a viral illness.  You are drinking plenty of fluids.  I have checked you today for COVID-19.  Those results should be available on your MyChart in 1 to 2 days.  Continue isolating until you receive results.  If COVID test positive continue isolation for total of 5 days from symptom onset.  As we discussed, continue bland diet for now and advance to a regular diet as tolerated.  Please return for any worsening or persistent symptoms.

## 2021-01-06 NOTE — ED Triage Notes (Signed)
Pt c/o vomiting since yesterday. Reports vomited this morning when woke up and has felt better after that. Denies pain or urinary or bowel problems.

## 2021-01-06 NOTE — ED Provider Notes (Signed)
[facility]    CSN: [id number] Arrival date & time: 01/06/21  1837      History   Chief Complaint Chief Complaint  Patient presents with   Emesis    HPI Bonnie Reilly is a 25 y.o. female otherwise healthy presents to urgent care today with vomiting.  Patient reports 2 episodes of vomiting last evening and 1 this morning associated with headache.  Symptoms have since resolved and able to tolerate bland diet without any recurrent vomiting.  Patient states symptoms began shortly after eating dinner at [facility].  She denies any recent fever or chills, abdominal pain, nausea or diarrhea, hematemesis, dysuria.  No concern for pregnancy.   History reviewed. No pertinent past medical history.  There are no problems to display for this patient.   History reviewed. No pertinent surgical history.  OB History   No obstetric history on file.      Home Medications    Prior to Admission medications   Medication Sig Start Date End Date Taking? Authorizing Provider  lidocaine (XYLOCAINE) 2 % solution Use as directed 15 mLs in the mouth or throat every 3 (three) hours as needed for mouth pain. 11/04/18   Snead, Mia A, PA-C  meloxicam (MOBIC) 15 MG tablet Take 1 tablet (15 mg total) by mouth daily. 12/09/20   Meccariello, Solmon Ice, DO  methocarbamol (ROBAXIN) 500 MG tablet Take 1 tablet (500 mg total) by mouth 2 (two) times daily. 11/04/18   Kassner, Mia A, PA-C  ondansetron (ZOFRAN-ODT) 4 MG disintegrating tablet Take 1 tablet (4 mg total) by mouth every 8 (eight) hours as needed for nausea or vomiting. 07/07/20   Georgetta Haber, NP    Family History Family History  Problem Relation Age of Onset   Healthy Mother    Healthy Father     Social History Social History   Tobacco Use   Smoking status: Never   Smokeless tobacco: Never  Substance Use Topics   Alcohol use: Yes   Drug use: Never     Allergies   Patient has no known allergies.   Review of Systems As  stated in HPI otherwise negative   Physical Exam Triage Vital Signs ED Triage Vitals  Enc Vitals Group     BP 01/06/21 1929 (!) 130/93     Pulse Rate 01/06/21 1929 82     Resp 01/06/21 1929 17     Temp 01/06/21 1929 98.8 F (37.1 C)     Temp Source 01/06/21 1929 Oral     SpO2 01/06/21 1929 97 %     Weight --      Height --      Head Circumference --      Peak Flow --      Pain Score 01/06/21 1927 0     Pain Loc --      Pain Edu? --      Excl. in [place]? --    No data found.  Updated Vital Signs BP (!) 130/93 (BP Location: Right Arm)   Pulse 82   Temp 98.8 F (37.1 C) (Oral)   Resp 17   SpO2 97%   Visual Acuity Right Eye Distance:   Left Eye Distance:   Bilateral Distance:    Right Eye Near:   Left Eye Near:    Bilateral Near:     Physical Exam Constitutional:      General: She is not in acute distress.    Appearance: She is obese. She  is not ill-appearing or toxic-appearing.  HENT:     Mouth/Throat:     Mouth: Mucous membranes are moist.     Pharynx: No oropharyngeal exudate or posterior oropharyngeal erythema.  Eyes:     General: No scleral icterus.    Extraocular Movements: Extraocular movements intact.  Cardiovascular:     Rate and Rhythm: Normal rate and regular rhythm.  Pulmonary:     Effort: Pulmonary effort is normal.     Breath sounds: Normal breath sounds. No wheezing, rhonchi or rales.  Abdominal:     General: Bowel sounds are normal.     Palpations: Abdomen is soft.     Tenderness: There is no abdominal tenderness. There is no right CVA tenderness, left CVA tenderness, guarding or rebound.  Musculoskeletal:        General: Normal range of motion.     Cervical back: Normal range of motion. No rigidity.  Skin:    General: Skin is warm and dry.  Neurological:     General: No focal deficit present.     Mental Status: She is alert and oriented to person, place, and time.  Psychiatric:        Mood and Affect: Mood normal.        Behavior:  Behavior normal.     UC Treatments / Results  Labs (all labs ordered are listed, but only abnormal results are displayed) Labs Reviewed  SARS CORONAVIRUS 2 (TAT 6-24 HRS)    EKG   Radiology No results found.  Procedures Procedures (including critical care time)  Medications Ordered in UC Medications - No data to display  Initial Impression / Assessment and Plan / UC Course  I have reviewed the triage vital signs and the nursing notes.  Pertinent labs & imaging results that were available during my care of the patient were reviewed by me and considered in my medical decision making (see chart for details).  Emesis -No associated abdominal pain and symptoms have since resolved.  Suspect foodborne illness versus viral etiology -No concern for intra-abdominal process or GU etiology -Will check for COVID-19.  Isolation and strict follow-up precautions discussed -Brat diet and advance as tolerated -For persistent or worsening symptoms  Reviewed expections re: course of current medical issues. Questions answered. Outlined signs and symptoms indicating need for more acute intervention. Pt verbalized understanding. AVS given   Final Clinical Impressions(s) / UC Diagnoses   Final diagnoses:  Non-intractable vomiting without nausea, unspecified vomiting type     Discharge Instructions      I suspect you have a viral illness.  You are drinking plenty of fluids.  I have checked you today for COVID-19.  Those results should be available on your MyChart in 1 to 2 days.  Continue isolating until you receive results.  If COVID test positive continue isolation for total of 5 days from symptom onset.  As we discussed, continue bland diet for now and advance to a regular diet as tolerated.  Please return for any worsening or persistent symptoms.     ED Prescriptions   None    PDMP not reviewed this encounter.   Rolla Etienne, NP 01/06/21 1956

## 2021-01-07 LAB — SARS CORONAVIRUS 2 (TAT 6-24 HRS): SARS Coronavirus 2: NEGATIVE

## 2021-03-22 ENCOUNTER — Encounter (HOSPITAL_COMMUNITY): Payer: Self-pay | Admitting: Emergency Medicine

## 2021-03-22 ENCOUNTER — Other Ambulatory Visit: Payer: Self-pay

## 2021-03-22 ENCOUNTER — Ambulatory Visit (HOSPITAL_COMMUNITY)
Admission: EM | Admit: 2021-03-22 | Discharge: 2021-03-22 | Disposition: A | Discharge status: Home / Self Care | Payer: Medicaid Other | Attending: Physician Assistant | Admitting: Physician Assistant

## 2021-03-22 DIAGNOSIS — G44319 Acute post-traumatic headache, not intractable: Secondary | ICD-10-CM

## 2021-03-22 DIAGNOSIS — S0990XA Unspecified injury of head, initial encounter: Secondary | ICD-10-CM

## 2021-03-22 MED ORDER — NAPROXEN 375 MG PO TABS: 375.0000 mg | ORAL_TABLET | Freq: Two times a day (BID) | ORAL | 0 refills | Status: DC

## 2021-03-22 NOTE — Discharge Instructions (Addendum)
Take Naprosyn twice daily.  Do not take additional NSAIDs including aspirin, ibuprofen/Advil, naproxen/Aleve as it can cause stomach bleeding.  You can use Tylenol for breakthrough pain.  As we discussed, I think you have a mild head injury.  Please avoid strenuous physical or mental activity.  I have provided you a work excuse note.  I recommend you follow-up with either our clinic or your primary care provider within a few days to ensure symptom improvement.  If you have any worsening symptoms including severe headache, nausea, vomiting, weakness, difficulty speaking, vision changes you must go to the emergency room immediately as we discussed.
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
## 2021-03-22 NOTE — ED Notes (Signed)
Discharged by Carolin Sicks
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
## 2021-03-31 ENCOUNTER — Ambulatory Visit (HOSPITAL_COMMUNITY): Payer: Self-pay

## 2021-12-31 ENCOUNTER — Ambulatory Visit (HOSPITAL_COMMUNITY)
Admission: EM | Admit: 2021-12-31 | Discharge: 2021-12-31 | Disposition: A | Discharge status: Home / Self Care | Payer: Medicaid Other | Attending: Emergency Medicine | Admitting: Emergency Medicine

## 2021-12-31 ENCOUNTER — Encounter (HOSPITAL_COMMUNITY): Payer: Self-pay | Admitting: Emergency Medicine

## 2021-12-31 DIAGNOSIS — K047 Periapical abscess without sinus: Secondary | ICD-10-CM

## 2021-12-31 MED ORDER — AMOXICILLIN-POT CLAVULANATE 875-125 MG PO TABS: 1.0000 | ORAL_TABLET | Freq: Two times a day (BID) | ORAL | 0 refills | Status: AC

## 2021-12-31 NOTE — ED Triage Notes (Signed)
Pt c/o right lower dental pain and swelling that got worse last night. Took ibuprofen today for pain.

## 2021-12-31 NOTE — ED Provider Notes (Signed)
[facility]    CSN: [id number] Arrival date & time: 12/31/21  1020      History   Chief Complaint Chief Complaint  Patient presents with   Dental Pain    HPI Bonnie Reilly is a 26 y.o. female.  Presents with 2 day history of dental pain. Reports last night it worsened. Located on bottom right, near the front canines Reports pain with chewing in that area. Sensitive to brush. No fevers, drainage, bleeding. No recent dental work No pain opening jaw.  History reviewed. No pertinent past medical history.  There are no problems to display for this patient.   Past Surgical History:  Procedure Laterality Date   KNEE SURGERY Left     OB History   No obstetric history on file.      Home Medications    Prior to Admission medications   Medication Sig Start Date End Date Taking? Authorizing Provider  amoxicillin-clavulanate (AUGMENTIN) 875-125 MG tablet Take 1 tablet by mouth 2 (two) times daily for 5 days. 12/31/21 01/05/22 Yes Ailah Barna, Lurena Joiner, PA-C    Family History Family History  Problem Relation Age of Onset   Healthy Mother    Healthy Father     Social History Social History   Tobacco Use   Smoking status: Never   Smokeless tobacco: Never  Vaping Use   Vaping Use: Never used  Substance Use Topics   Alcohol use: Yes   Drug use: Never    Types: Marijuana     Allergies   Patient has no known allergies.   Review of Systems Review of Systems Per HPI  Physical Exam Triage Vital Signs ED Triage Vitals  Enc Vitals Group     BP 12/31/21 1056 128/89     Pulse Rate 12/31/21 1056 70     Resp 12/31/21 1056 17     Temp 12/31/21 1056 99 F (37.2 C)     Temp Source 12/31/21 1056 Oral     SpO2 12/31/21 1056 100 %     Weight --      Height --      Head Circumference --      Peak Flow --      Pain Score 12/31/21 1054 6     Pain Loc --      Pain Edu? --      Excl. in [place]? --    No data found.  Updated Vital Signs BP 128/89 (BP  Location: Left Arm)   Pulse 70   Temp 99 F (37.2 C) (Oral)   Resp 17   SpO2 100%    Physical Exam Vitals and nursing note reviewed.  Constitutional:      General: She is not in acute distress.    Appearance: Normal appearance.  HENT:     Mouth/Throat:     Mouth: Mucous membranes are moist. No oral lesions or angioedema.     Dentition: Abnormal dentition. Dental tenderness, gingival swelling and dental abscesses present.     Pharynx: Oropharynx is clear. No pharyngeal swelling or posterior oropharyngeal erythema.      Comments: Pain with palpation of right bottom premolar, gum swelling around it. Some fluctuance of gum. No open lesions or bleeding. No swelling of lips or tongue Cardiovascular:     Rate and Rhythm: Normal rate and regular rhythm.     Pulses: Normal pulses.     Heart sounds: Normal heart sounds.  Pulmonary:     Effort: Pulmonary effort is normal.  Breath sounds: Normal breath sounds.  Musculoskeletal:     Cervical back: Normal range of motion.  Lymphadenopathy:     Cervical: No cervical adenopathy.  Neurological:     Mental Status: She is alert and oriented to person, place, and time.      UC Treatments / Results  Labs (all labs ordered are listed, but only abnormal results are displayed) Labs Reviewed - No data to display  EKG  Radiology No results found.  Procedures Procedures   Medications Ordered in UC Medications - No data to display  Initial Impression / Assessment and Plan / UC Course  I have reviewed the triage vital signs and the nursing notes.  Pertinent labs & imaging results that were available during my care of the patient were reviewed by me and considered in my medical decision making (see chart for details).  With dental tenderness, gum swelling, will treat for possible dental abscess. Augmentin twice daily for 5 days. Salt water rinses, gentle brushing, follow up with dentist. Provided dental resources. Return precautions  discussed. Patient agrees to plan  Final Clinical Impressions(s) / UC Diagnoses   Final diagnoses:  Dental abscess     Discharge Instructions      Please take medication as prescribed. Take with food to avoid upset stomach.  You can try salt-water rinses if this helps.  Please follow up with a dentist as soon as you are able.    ED Prescriptions     Medication Sig Dispense Auth. Provider   amoxicillin-clavulanate (AUGMENTIN) 875-125 MG tablet Take 1 tablet by mouth 2 (two) times daily for 5 days. 10 tablet Kirti Carl, Lurena Joiner, PA-C      PDMP not reviewed this encounter.   Irmalee Riemenschneider, Lurena Joiner, [place] 12/31/21 1259

## 2021-12-31 NOTE — Discharge Instructions (Addendum)
Please take medication as prescribed. Take with food to avoid upset stomach.  You can try salt-water rinses if this helps.  Please follow up with a dentist as soon as you are able.

## 2022-03-17 ENCOUNTER — Ambulatory Visit (HOSPITAL_COMMUNITY)
Admission: EM | Admit: 2022-03-17 | Discharge: 2022-03-17 | Disposition: A | Discharge status: Home / Self Care | Payer: Self-pay | Attending: Emergency Medicine | Admitting: Emergency Medicine

## 2022-03-17 ENCOUNTER — Other Ambulatory Visit: Payer: Self-pay

## 2022-03-17 ENCOUNTER — Encounter (HOSPITAL_COMMUNITY): Payer: Self-pay | Admitting: Emergency Medicine

## 2022-03-17 DIAGNOSIS — R1111 Vomiting without nausea: Secondary | ICD-10-CM | POA: Insufficient documentation

## 2022-03-17 DIAGNOSIS — Z20822 Contact with and (suspected) exposure to covid-19: Secondary | ICD-10-CM | POA: Insufficient documentation

## 2022-03-17 DIAGNOSIS — E860 Dehydration: Secondary | ICD-10-CM

## 2022-03-17 LAB — RESP PANEL BY RT-PCR (FLU A&B, COVID) ARPGX2
Influenza A by PCR: NEGATIVE
Influenza B by PCR: NEGATIVE
SARS Coronavirus 2 by RT PCR: NEGATIVE

## 2022-03-17 MED ORDER — ONDANSETRON 8 MG PO TBDP: ORAL_TABLET | ORAL | 0 refills | Status: DC

## 2022-03-17 NOTE — Discharge Instructions (Addendum)
I suspect that this was something that you ate.  Urinalysis is negative for UTI.  Take the Zofran.  It may make you constipated.  Push electrolyte containing fluids such as Pedialyte, Gatorade, liquid IV.  We will contact you if your COVID or flu, positive and prescribe the appropriate antiviral.  Below is a list of primary care practices who are taking new patients for you to follow-up with.  [facility] -multiple locations.  See website at [email or web address]  [facility] internal medicine clinic Ground Floor - [facility], [address] [place], [place], [place] [address] [phone]  [facility] Care at [facility] [address] [place], [place] [address] [phone]  [facility]- will see patients with no insurance [address] [place], [place] [address] [place], [place] [address] [phone]  [facility] [phone] [address] [place] [address]  [facility]: [address] [place] [address]  [phone]  [facility]: [address] [place] [address]  [phone]  [facility] : [address] [place] [address] [phone]  [facility] Care: [address] [place] [address] [phone]  [facility]: [address] [place] [address] [phone]  Dr. Oneal Grout [address] [place]  [facility]- will see patients with no insurance. [address] [place], [place] [address] [phone]  Go to www.goodrx.com  or www.costplusdrugs.com to look up your medications. This will give you a list of where you can find your prescriptions at the most affordable prices. Or ask the pharmacist what the cash price is, or if they have any other discount  programs available to help make your medication more affordable. This can be less expensive than what you would pay with insurance.

## 2022-03-17 NOTE — ED Provider Notes (Signed)
HPI  SUBJECTIVE:  Bonnie Reilly is a 26 y.o. female who presents with 3 episodes of nonbilious, nonbloody emesis starting yesterday.  She reports 1 episode of emesis today.  She reports anorexia, states that she is scared to eat because she is afraid that she will throw it up.  She reports a headache starting after vomiting.  Last bowel movement was 2 days ago, it was normal color and amount.  She is tolerating fluids today.  Symptoms started after eating at [facility].  She denies new foods, anyone sharing her plate who have similar symptoms.  No raw, undercooked foods, questionable leftovers, contacts with similar illness.  No recent antibiotics.  No known COVID or flu exposure.  She had 2 doses of the COVID-vaccine.  She did not get this years flu vaccine.  No nausea, loss of sense of smell or taste, cough, wheeze, shortness of breath, fevers, body aches, nasal congestion, rhinorrhea, sore throat, diarrhea.  No abdominal pain, change in urine output, abdominal distention, neck stiffness, rash, photophobia.  No recent alcohol intake.  No antipyretic in the past 6 hours.  She tried Advil for headache with improvement.  Her headache is worse with vomiting.  She has also tried increasing her water intake.  She has a past medical history of COVID in 2021.  No history of diabetes, hypertension, abdominal surgeries, UTI.  LMP: Last month.  Denies the possibility of being pregnant.  PCP: None.   History reviewed. No pertinent past medical history.  Past Surgical History:  Procedure Laterality Date   KNEE SURGERY Left     Family History  Problem Relation Age of Onset   Healthy Mother    Healthy Father     Social History   Tobacco Use   Smoking status: Never   Smokeless tobacco: Never  Vaping Use   Vaping Use: Never used  Substance Use Topics   Alcohol use: Yes   Drug use: Not Currently    Types: Marijuana    No current facility-administered medications for this encounter.  Current  Outpatient Medications:    ondansetron (ZOFRAN-ODT) 8 MG disintegrating tablet, 1/2- 1 tablet q 8 hr prn nausea, vomiting, Disp: 20 tablet, Rfl: 0  No Known Allergies   ROS  As noted in HPI.   Physical Exam  BP 138/84 (BP Location: Right Arm) Comment (BP Location): large cuff  Pulse 71   Temp 97.9 F (36.6 C) (Oral)   Resp 20   LMP 02/15/2022   SpO2 97%   Constitutional: Well developed, well nourished, no acute distress Eyes: PERRL, EOMI, conjunctiva normal bilaterally HENT: Normocephalic, atraumatic,mucus membranes moist.  No nasal congestion.  No maxillary, frontal sinus tenderness Neck: No cervical lymphadenopathy, meningismus Respiratory: Clear to auscultation bilaterally, no rales, no wheezing, no rhonchi Cardiovascular: Normal rate and rhythm, no murmurs, no gallops, no rubs GI: Soft, nondistended, normal bowel sounds, nontender, no rebound, no guarding Back: no CVAT skin: No rash, skin intact.  Capillary refill less than 2 seconds. Musculoskeletal: No edema, no tenderness, no deformities Neurologic: Alert & oriented x 3, CN III-XII grossly intact, no motor deficits, sensation grossly intact Psychiatric: Speech and behavior appropriate   ED Course   Medications - No data to display  Orders Placed This Encounter  Procedures   Resp Panel by RT-PCR (Flu A&B, Covid) Anterior Nasal Swab    Standing Status:   Standing    Number of Occurrences:   1   No results found for this or any previous visit (  from the past 24 hour(s)). No results found.  ED Clinical Impression  1. Vomiting without nausea, unspecified vomiting type   2. Encounter for laboratory testing for COVID-19 virus      ED Assessment/Plan     Patient appears well-hydrated, no signs of meningitis.  Her abdomen is benign.  Vitals are normal.  will check a COVID and flu.  Will prescribe antivirals if either 1 of these are positive due to BMI above 30.  Suspect that this was something she ate. Headache  started after vomiting, suspect mild dehydration.  Home with Zofran, electrolyte containing fluids.  ER return precautions given.  Will provide primary care list for routine care.  Discussed labs, MDM, treatment plan, and plan for follow-up with patient Discussed sn/sx that should prompt return to the ED. patient agrees with plan.   Meds ordered this encounter  Medications   ondansetron (ZOFRAN-ODT) 8 MG disintegrating tablet    Sig: 1/2- 1 tablet q 8 hr prn nausea, vomiting    Dispense:  20 tablet    Refill:  0      *This clinic note was created using Scientist, clinical (histocompatibility and immunogenetics). Therefore, there may be occasional mistakes despite careful proofreading. ?    Domenick Gong, MD 03/17/22 1553

## 2022-03-17 NOTE — ED Triage Notes (Signed)
For 2 days, patient has had a headache and has had vomiting.  Patient has had 1 episode of pain today, 3 times yesterday.    Patient reports taking advil

## 2022-04-19 ENCOUNTER — Encounter (HOSPITAL_COMMUNITY): Payer: Self-pay | Admitting: *Deleted

## 2022-04-19 ENCOUNTER — Ambulatory Visit (HOSPITAL_COMMUNITY)
Admission: EM | Admit: 2022-04-19 | Discharge: 2022-04-19 | Disposition: A | Discharge status: Home / Self Care | Payer: Self-pay | Attending: Internal Medicine | Admitting: Internal Medicine

## 2022-04-19 ENCOUNTER — Other Ambulatory Visit: Payer: Self-pay

## 2022-04-19 DIAGNOSIS — H5789 Other specified disorders of eye and adnexa: Secondary | ICD-10-CM

## 2022-04-19 DIAGNOSIS — S0502XA Injury of conjunctiva and corneal abrasion without foreign body, left eye, initial encounter: Secondary | ICD-10-CM

## 2022-04-19 MED ORDER — ERYTHROMYCIN 5 MG/GM OP OINT: TOPICAL_OINTMENT | OPHTHALMIC | 0 refills | Status: DC

## 2022-04-19 MED ORDER — TETRACAINE HCL 0.5 % OP SOLN
OPHTHALMIC | Status: AC
  Filled 2022-04-19: qty 4

## 2022-04-19 MED ORDER — FLUORESCEIN SODIUM 1 MG OP STRP
ORAL_STRIP | OPHTHALMIC | Status: AC
  Filled 2022-04-19: qty 1

## 2022-04-19 NOTE — Discharge Instructions (Signed)
You have a scratch to your left eye called a corneal abrasion. I would like for you to apply a thin ribbon of erythromycin eye ointment to the left eye twice daily for the next 7 days. Use a warm clean wash rag to apply warm compresses to the left eye before you apply the ointment. Avoid scratching your eye. You may take Zyrtec as needed over-the-counter once daily to help with itching.  If you develop any new or worsening symptoms or do not improve in the next 2 to 3 days, please return.  If your symptoms are severe, please go to the emergency room.  Follow-up with your primary care provider for further evaluation and management of your symptoms as well as ongoing wellness visits.  I hope you feel better!

## 2022-04-19 NOTE — ED Triage Notes (Signed)
Pt reports irritation started on WED to LT eye. Pt also reports itching started to Lt lid yesterday. Some swelling noted on Lt lower eye lid.

## 2022-04-23 NOTE — ED Provider Notes (Signed)
[facility]    CSN: [id number] Arrival date & time: 04/19/22  7846      History   Chief Complaint Chief Complaint  Patient presents with   Eye Problem    HPI Bonnie KANAAN is a 26 y.o. female.   Patient presents to urgent care for evaluation of foreign body sensation to the left eye that started a few days ago. Foreign body sensation improved, eye became itchy and slightly swollen/red yesterday. Works in a [employer] and reports dust may have gotten in her eye initially to cause foreign body sensation. Symptoms have improved significantly since onset. She does not use glasses or contacts. Denies recent trauma/injuries to the eye, blurry vision, spots in vision, and change in visual acuity. The eye itself is not painful. Denies headache, dizziness, and recent viral URI symptoms/fever. No attempted use of any over the counter medications prior to arrival.    Eye Problem   History reviewed. No pertinent past medical history.  There are no problems to display for this patient.   Past Surgical History:  Procedure Laterality Date   KNEE SURGERY Left     OB History   No obstetric history on file.      Home Medications    Prior to Admission medications   Medication Sig Start Date End Date Taking? Authorizing Provider  erythromycin ophthalmic ointment Place a 1/2 inch ribbon of ointment into the lower eyelid. 04/19/22  Yes Carlisle Beers, FNP  ondansetron (ZOFRAN-ODT) 8 MG disintegrating tablet 1/2- 1 tablet q 8 hr prn nausea, vomiting 03/17/22   Domenick Gong, MD    Family History Family History  Problem Relation Age of Onset   Healthy Mother    Healthy Father     Social History Social History   Tobacco Use   Smoking status: Never   Smokeless tobacco: Never  Vaping Use   Vaping Use: Never used  Substance Use Topics   Alcohol use: Yes   Drug use: Not Currently    Types: Marijuana     Allergies   Patient has no known  allergies.   Review of Systems Review of Systems Per HPI  Physical Exam Triage Vital Signs ED Triage Vitals  Enc Vitals Group     BP 04/19/22 1153 139/87     Pulse Rate 04/19/22 1153 66     Resp 04/19/22 1153 18     Temp 04/19/22 1153 97.9 F (36.6 C)     Temp Source 04/19/22 1153 Oral     SpO2 04/19/22 1153 96 %     Weight --      Height --      Head Circumference --      Peak Flow --      Pain Score 04/19/22 1152 0     Pain Loc --      Pain Edu? --      Excl. in [place]? --    No data found.  Updated Vital Signs BP 139/87   Pulse 66   Temp 97.9 F (36.6 C) (Oral)   Resp 18   LMP 04/03/2022 (Approximate)   SpO2 96%   Visual Acuity Right Eye Distance:   Left Eye Distance:   Bilateral Distance:    Right Eye Near:   Left Eye Near:    Bilateral Near:     Physical Exam Vitals and nursing note reviewed.  Constitutional:      Appearance: She is not ill-appearing or toxic-appearing.  HENT:  Head: Normocephalic and atraumatic.     Right Ear: Hearing, tympanic membrane, ear canal and external ear normal.     Left Ear: Hearing, tympanic membrane, ear canal and external ear normal.     Nose: Nose normal.     Mouth/Throat:     Lips: Pink.     Mouth: Mucous membranes are moist.  Eyes:     General: Lids are normal. Lids are everted, no foreign bodies appreciated. Vision grossly intact. Gaze aligned appropriately. No visual field deficit.       Right eye: No discharge.        Left eye: No foreign body or discharge.     Extraocular Movements: Extraocular movements intact.     Conjunctiva/sclera:     Right eye: Right conjunctiva is not injected.     Left eye: Left conjunctiva is injected.     Pupils: Pupils are equal, round, and reactive to light.     Left eye: Corneal abrasion and fluorescein uptake present.     Comments: EOMs intact without dizziness or pain. Fluorescin uptake to the inferior aspect of the left sclera. No ulcerations or hyphema.    Cardiovascular:      Rate and Rhythm: Normal rate and regular rhythm.     Heart sounds: Normal heart sounds, S1 normal and S2 normal.  Pulmonary:     Effort: Pulmonary effort is normal. No respiratory distress.     Breath sounds: Normal breath sounds and air entry.  Abdominal:     General: Bowel sounds are normal.     Palpations: Abdomen is soft.     Tenderness: There is no abdominal tenderness. There is no right CVA tenderness, left CVA tenderness or guarding.  Musculoskeletal:     Cervical back: Neck supple.  Skin:    General: Skin is warm and dry.     Capillary Refill: Capillary refill takes less than 2 seconds.     Findings: No rash.  Neurological:     General: No focal deficit present.     Mental Status: She is alert and oriented to person, place, and time. Mental status is at baseline.     Cranial Nerves: No dysarthria or facial asymmetry.  Psychiatric:        Mood and Affect: Mood normal.        Speech: Speech normal.        Behavior: Behavior normal.        Thought Content: Thought content normal.        Judgment: Judgment normal.      UC Treatments / Results  Labs (all labs ordered are listed, but only abnormal results are displayed) Labs Reviewed - No data to display  EKG   Radiology No results found.  Procedures Procedures (including critical care time)  Medications Ordered in UC Medications - No data to display  Initial Impression / Assessment and Plan / UC Course  I have reviewed the triage vital signs and the nursing notes.  Pertinent labs & imaging results that were available during my care of the patient were reviewed by me and considered in my medical decision making (see chart for details).   1. Abrasion of left cornea Fluorescin staining reveals corneal abrasion to the left eye at the lower lid line. Will cover for infection with erythromycin eye ointment 2 times a day for the next 7 days. Walking referral to ophthalmology provided for follow-up as needed.  Vision is grossly intact. No indication for referral to ED.  Discussed physical exam and available lab work findings in clinic with patient.  Counseled patient regarding appropriate use of medications and potential side effects for all medications recommended or prescribed today. Discussed red flag signs and symptoms of worsening condition,when to call the PCP office, return to urgent care, and when to seek higher level of care in the emergency department. Patient verbalizes understanding and agreement with plan. All questions answered. Patient discharged in stable condition.    Final Clinical Impressions(s) / UC Diagnoses   Final diagnoses:  Abrasion of left cornea, initial encounter  Eye irritation     Discharge Instructions      You have a scratch to your left eye called a corneal abrasion. I would like for you to apply a thin ribbon of erythromycin eye ointment to the left eye twice daily for the next 7 days. Use a warm clean wash rag to apply warm compresses to the left eye before you apply the ointment. Avoid scratching your eye. You may take Zyrtec as needed over-the-counter once daily to help with itching.  If you develop any new or worsening symptoms or do not improve in the next 2 to 3 days, please return.  If your symptoms are severe, please go to the emergency room.  Follow-up with your primary care provider for further evaluation and management of your symptoms as well as ongoing wellness visits.  I hope you feel better!   ED Prescriptions     Medication Sig Dispense Auth. Provider   erythromycin ophthalmic ointment Place a 1/2 inch ribbon of ointment into the lower eyelid. 3.5 g Carlisle Beers, FNP      PDMP not reviewed this encounter.   Reita May McIntosh, [place] 04/23/22 [phone]

## 2022-06-07 ENCOUNTER — Ambulatory Visit (HOSPITAL_COMMUNITY)
Admission: RE | Admit: 2022-06-07 | Discharge: 2022-06-07 | Disposition: A | Discharge status: Home / Self Care | Payer: Medicaid Other | Source: Ambulatory Visit | Attending: Family Medicine | Admitting: Family Medicine

## 2022-06-07 ENCOUNTER — Encounter (HOSPITAL_COMMUNITY): Payer: Self-pay

## 2022-06-07 VITALS — BP 123/83 | HR 72 | Temp 98.0°F | Resp 16

## 2022-06-07 DIAGNOSIS — J029 Acute pharyngitis, unspecified: Secondary | ICD-10-CM

## 2022-06-07 DIAGNOSIS — J069 Acute upper respiratory infection, unspecified: Secondary | ICD-10-CM

## 2022-06-07 DIAGNOSIS — U071 COVID-19: Secondary | ICD-10-CM | POA: Insufficient documentation

## 2022-06-07 LAB — CREATININE, SERUM
Creatinine, Ser: 0.81 mg/dL (ref 0.44–1.00)
GFR, Estimated: >60 mL/min (ref >60)

## 2022-06-07 LAB — POCT RAPID STREP A, ED / UC: Streptococcus, Group A Screen (Direct): NEGATIVE

## 2022-06-07 NOTE — ED Triage Notes (Signed)
Pt states sore throat and congestion for the past 2 days.

## 2022-06-07 NOTE — ED Provider Notes (Addendum)
[facility]    CSN: [id number] Arrival date & time: 06/07/22  1148      History   Chief Complaint Chief Complaint  Patient presents with   Sore Throat    Entered by patient    HPI Bonnie Reilly is a 27 y.o. female.    Sore Throat   Here for sore throat that began on February 3.  Had some cough and nasal congestion and rhinorrhea.  No fever or chills and no myalgia.  No nausea or vomiting or diarrhea.  She has been exposed to her younger brother who was diagnosed with the flu last week.  Last menstrual cycle was January 11  History reviewed. No pertinent past medical history.  There are no problems to display for this patient.   Past Surgical History:  Procedure Laterality Date   KNEE SURGERY Left     OB History   No obstetric history on file.      Home Medications    Prior to Admission medications   Not on File    Family History Family History  Problem Relation Age of Onset   Healthy Mother    Healthy Father     Social History Social History   Tobacco Use   Smoking status: Never   Smokeless tobacco: Never  Vaping Use   Vaping Use: Never used  Substance Use Topics   Alcohol use: Yes   Drug use: Not Currently    Types: Marijuana     Allergies   Patient has no known allergies.   Review of Systems Review of Systems   Physical Exam Triage Vital Signs ED Triage Vitals  Enc Vitals Group     BP 06/07/22 1217 123/83     Pulse Rate 06/07/22 1217 72     Resp 06/07/22 1217 16     Temp 06/07/22 1217 98 F (36.7 C)     Temp Source 06/07/22 1217 Oral     SpO2 06/07/22 1217 95 %     Weight --      Height --      Head Circumference --      Peak Flow --      Pain Score 06/07/22 1218 5     Pain Loc --      Pain Edu? --      Excl. in [place]? --    No data found.  Updated Vital Signs BP 123/83 (BP Location: Left Arm)   Pulse 72   Temp 98 F (36.7 C) (Oral)   Resp 16   LMP 05/13/2022 (Approximate)   SpO2 95%    Visual Acuity Right Eye Distance:   Left Eye Distance:   Bilateral Distance:    Right Eye Near:   Left Eye Near:    Bilateral Near:     Physical Exam Vitals reviewed.  Constitutional:      General: She is not in acute distress.    Appearance: She is not toxic-appearing.  HENT:     Right Ear: Tympanic membrane and ear canal normal.     Left Ear: Tympanic membrane and ear canal normal.     Nose: Congestion present.     Mouth/Throat:     Mouth: Mucous membranes are moist.     Comments: There is mild erythema of the oropharynx and tonsils are hypertrophied 2+. Eyes:     Extraocular Movements: Extraocular movements intact.     Conjunctiva/sclera: Conjunctivae normal.     Pupils: Pupils are equal, round, and  reactive to light.  Cardiovascular:     Rate and Rhythm: Normal rate and regular rhythm.     Heart sounds: No murmur heard. Pulmonary:     Effort: Pulmonary effort is normal. No respiratory distress.     Breath sounds: No stridor. No wheezing, rhonchi or rales.  Musculoskeletal:     Cervical back: Neck supple.  Lymphadenopathy:     Cervical: No cervical adenopathy.  Skin:    Capillary Refill: Capillary refill takes less than 2 seconds.     Coloration: Skin is not jaundiced or pale.  Neurological:     General: No focal deficit present.     Mental Status: She is alert and oriented to person, place, and time.  Psychiatric:        Behavior: Behavior normal.      UC Treatments / Results  Labs (all labs ordered are listed, but only abnormal results are displayed) Labs Reviewed  CULTURE, GROUP A STREP ([place])  SARS CORONAVIRUS 2 (TAT 6-24 HRS)  CREATININE, SERUM  POCT RAPID STREP A, ED / UC    EKG   Radiology No results found.  Procedures Procedures (including critical care time)  Medications Ordered in UC Medications - No data to display  Initial Impression / Assessment and Plan / UC Course  I have reviewed the triage vital signs and the nursing  notes.  Pertinent labs & imaging results that were available during my care of the patient were reviewed by me and considered in my medical decision making (see chart for details).           Strep is negative.  Culture is sent and we will treat per protocol if positive.  With no fever, I am not going to test for the flu  COVID swab was done and if positive, she is a candidate for paxlovid, as long as her gfr is nl, drawn today Final Clinical Impressions(s) / UC Diagnoses   Final diagnoses:  Viral upper respiratory tract infection  Sore throat     Discharge Instructions      Your strep test is negative.  Culture of the throat will be sent, and staff will notify you if that is in turn positive.   You have been swabbed for COVID, and the test will result in the next 24 hours. Our staff will call you if positive. If the COVID test is positive, you should quarantine for 5 days from the start of your symptoms.  On days 6-10 from the start of your illness, you should wear a mask if out in public.       ED Prescriptions   None    PDMP not reviewed this encounter.   Barrett Henle, MD 06/07/22 1303    Barrett Henle, MD 06/07/22 1310

## 2022-06-07 NOTE — Discharge Instructions (Signed)
Your strep test is negative.  Culture of the throat will be sent, and staff will notify you if that is in turn positive.   You have been swabbed for COVID, and the test will result in the next 24 hours. Our staff will call you if positive. If the COVID test is positive, you should quarantine for 5 days from the start of your symptoms.  On days 6-10 from the start of your illness, you should wear a mask if out in public.

## 2022-06-08 ENCOUNTER — Telehealth (HOSPITAL_COMMUNITY): Payer: Self-pay | Admitting: Emergency Medicine

## 2022-06-08 LAB — SARS CORONAVIRUS 2 (TAT 6-24 HRS): SARS Coronavirus 2: POSITIVE — AB

## 2022-06-10 LAB — CULTURE, GROUP A STREP (THRC)

## 2022-07-20 IMAGING — DX DG CHEST 1V PORT
1 series · 1 of 1 positions shown · non-contrast
Comparison: None.

CLINICAL DATA: Shortness of breath.  History of [id number] infection.

EXAM:
PORTABLE CHEST 1 VIEW

[chest ap]
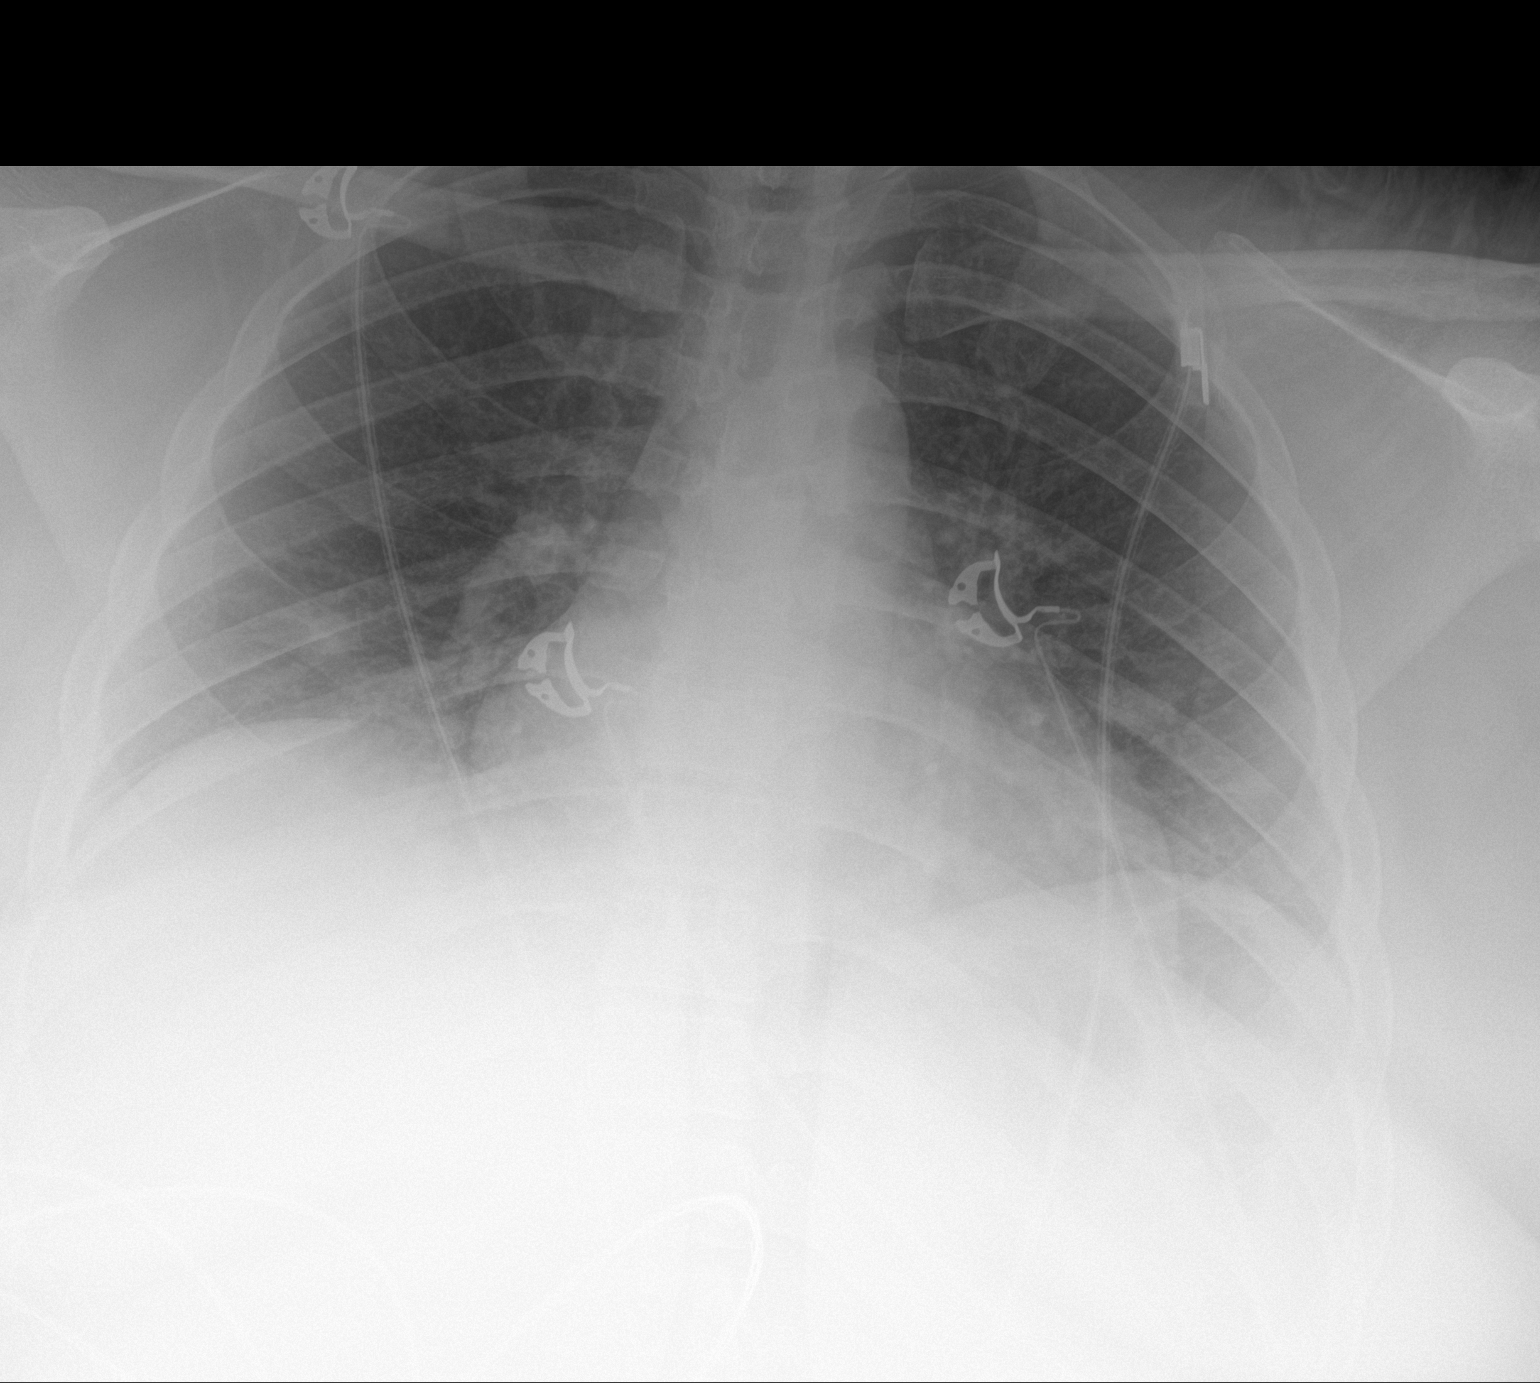

[1 of 1 positions shown; findings below may reference images not displayed]

FINDINGS: Single view of the chest demonstrates low lung volumes. Hazy
densities in the right lower lung. Heart size is within normal
limits. Trachea is midline. Negative for a pneumothorax.
IMPRESSION: Low lung volumes with hazy densities in the right lower lung.
Differential would include atelectasis versus atypical infection
such as COVID pneumonia.

## 2022-08-10 IMAGING — DX DG KNEE AP/LAT W/ SUNRISE*R*
3 series · 3 of 3 positions shown · non-contrast
Comparison: None.

CLINICAL DATA: Right knee pain

EXAM:
RIGHT KNEE 3 VIEWS

[knee ap]
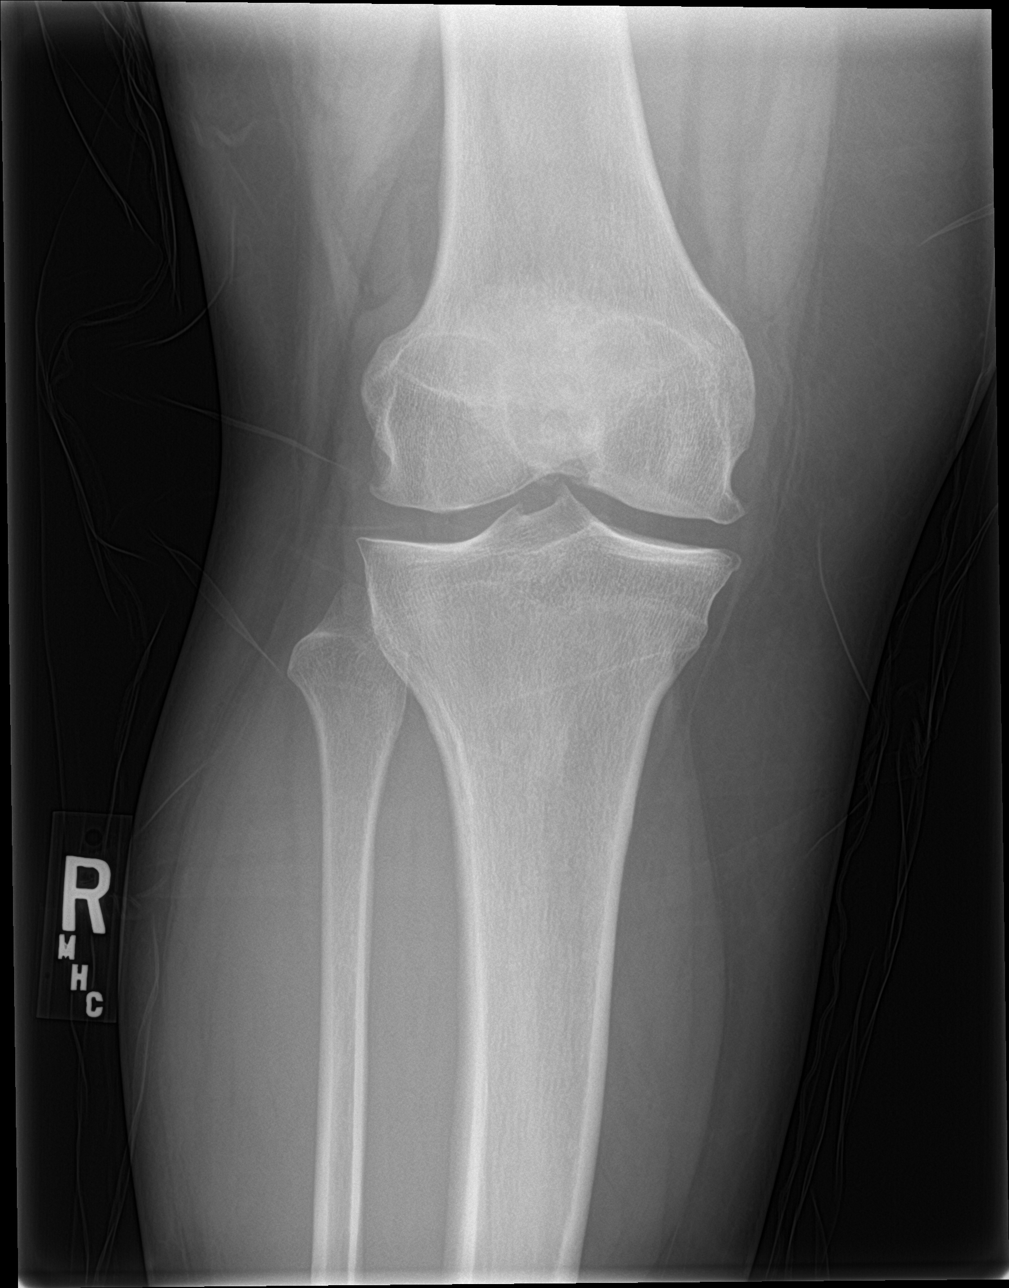

[knee lat]
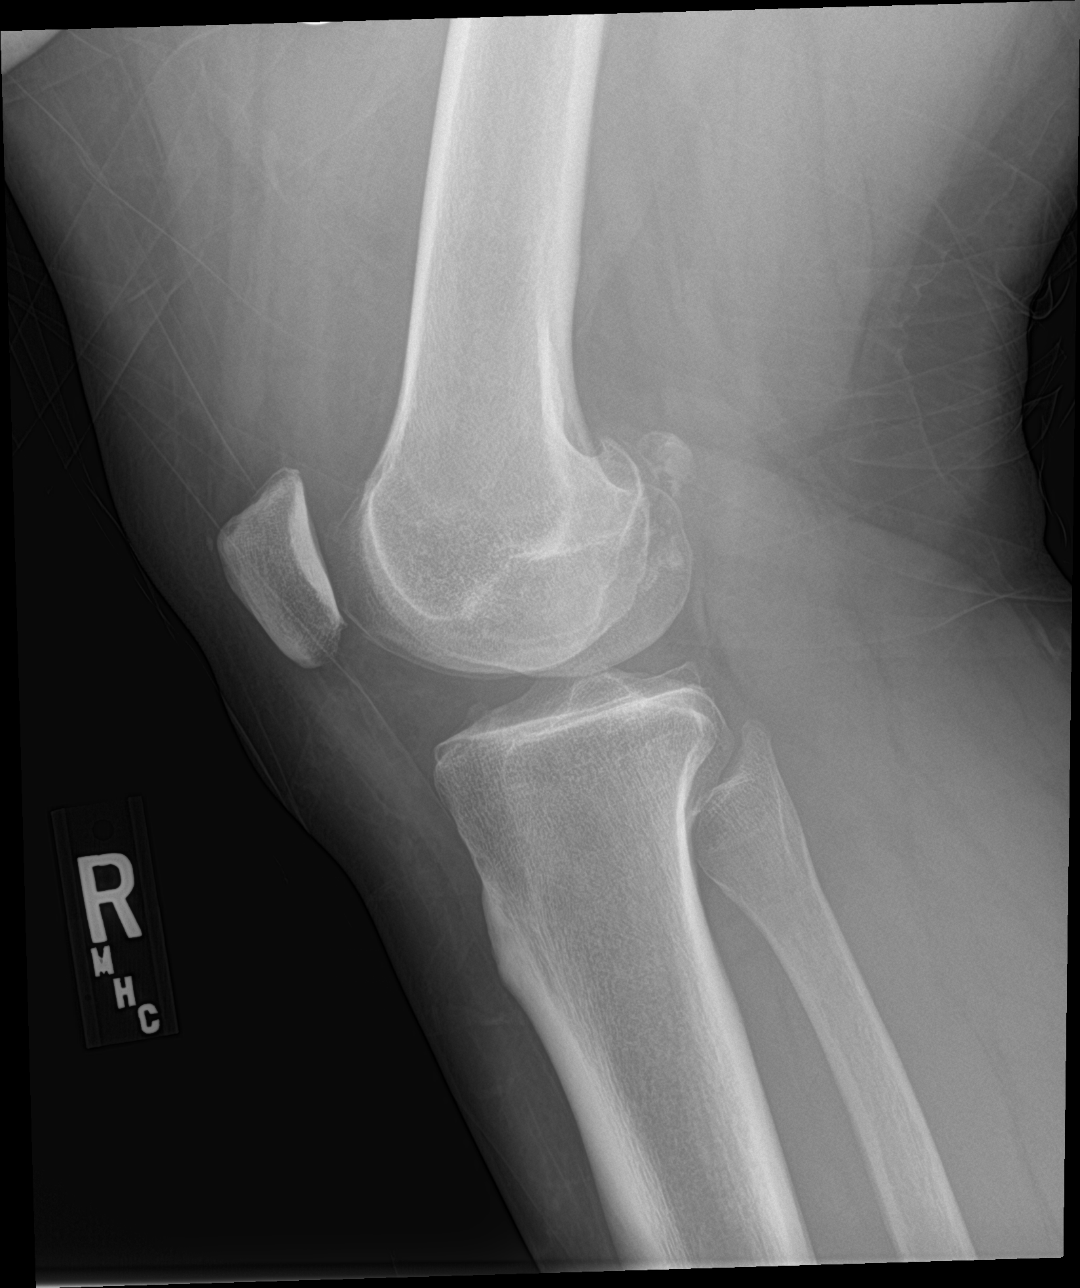

[knee sunrise]
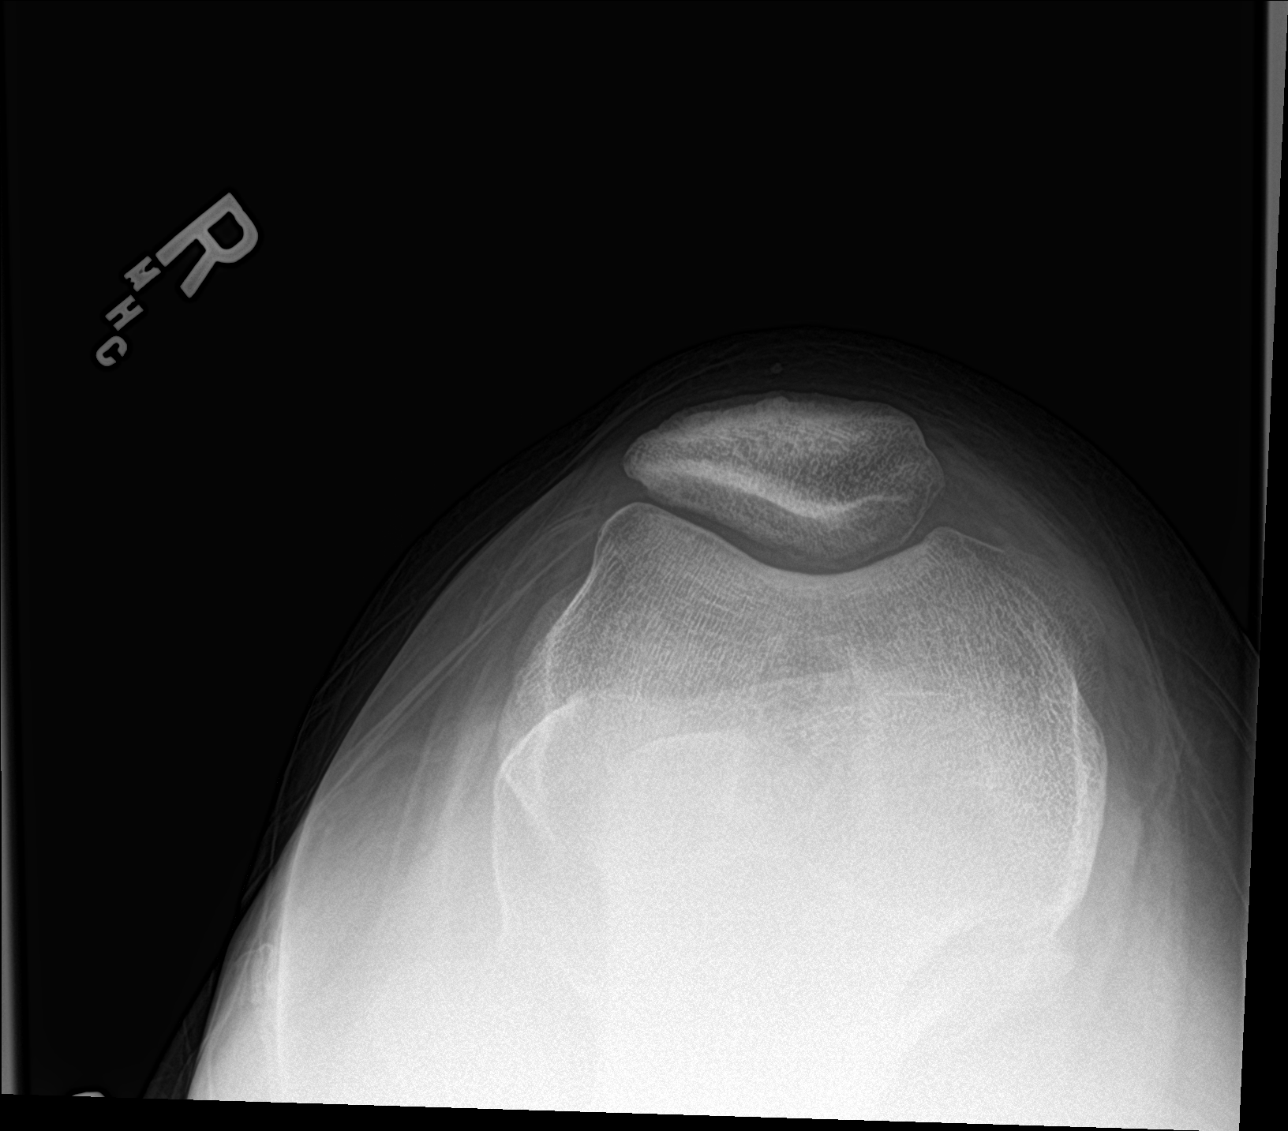

[3 of 3 positions shown; findings below may reference images not displayed]

FINDINGS: No acute bony abnormality. Specifically, no fracture, subluxation,
or dislocation. Joint spaces maintained. No joint effusion.
IMPRESSION: Negative.

## 2023-04-22 ENCOUNTER — Encounter (HOSPITAL_COMMUNITY): Payer: Self-pay

## 2023-04-22 ENCOUNTER — Ambulatory Visit (HOSPITAL_COMMUNITY)
Admission: RE | Admit: 2023-04-22 | Discharge: 2023-04-22 | Disposition: A | Discharge status: Home / Self Care | Payer: No Typology Code available for payment source | Source: Ambulatory Visit | Attending: Nurse Practitioner

## 2023-04-22 VITALS — BP 147/93 | HR 101 | Temp 102.7°F | Resp 18 | Ht 63.0 in | Wt 260.0 lb

## 2023-04-22 DIAGNOSIS — R110 Nausea: Secondary | ICD-10-CM

## 2023-04-22 DIAGNOSIS — K5900 Constipation, unspecified: Secondary | ICD-10-CM

## 2023-04-22 DIAGNOSIS — N10 Acute tubulo-interstitial nephritis: Secondary | ICD-10-CM

## 2023-04-22 LAB — POC COVID19/FLU A&B COMBO
Covid Antigen, POC: NEGATIVE
Influenza A Antigen, POC: NEGATIVE
Influenza B Antigen, POC: NEGATIVE

## 2023-04-22 LAB — POCT URINALYSIS DIP (MANUAL ENTRY)
Bilirubin, UA: NEGATIVE
Glucose, UA: NEGATIVE mg/dL
Ketones, POC UA: NEGATIVE mg/dL
Nitrite, UA: NEGATIVE
Protein Ur, POC: 30 mg/dL — AB
Spec Grav, UA: 1.02 (ref 1.010–1.025)
Urobilinogen, UA: 1 U/dL
pH, UA: 5.5 (ref 5.0–8.0)

## 2023-04-22 LAB — POCT URINE PREGNANCY: Preg Test, Ur: NEGATIVE

## 2023-04-22 MED ORDER — ONDANSETRON HCL 4 MG PO TABS: 4.0000 mg | ORAL_TABLET | Freq: Four times a day (QID) | ORAL | 0 refills | Status: DC

## 2023-04-22 MED ORDER — ACETAMINOPHEN 325 MG PO TABS
650.0000 mg | ORAL_TABLET | Freq: Once | ORAL | Status: AC
  Administered 2023-04-22: 650 mg via ORAL

## 2023-04-22 MED ORDER — SULFAMETHOXAZOLE-TRIMETHOPRIM 800-160 MG PO TABS: 1.0000 | ORAL_TABLET | Freq: Two times a day (BID) | ORAL | 0 refills | Status: AC

## 2023-04-22 MED ORDER — ACETAMINOPHEN 325 MG PO TABS
ORAL_TABLET | ORAL | Status: AC
  Filled 2023-04-22: qty 2

## 2023-04-22 NOTE — ED Provider Notes (Signed)
[facility]    CSN: [id number] Arrival date & time: 04/22/23  1554      History   Chief Complaint Chief Complaint  Patient presents with   Back Pain    HPI Bonnie Reilly is a 27 y.o. female.   Patient reporting symptoms x 3 days.  She states that she started with low back pain radiating to the front.  She has nausea but has worsened over the last couple of days.  She denies any burning with urination she denies any upper respiratory symptoms.  The history is provided by the patient.  Back Pain Location:  Lumbar spine Quality:  Aching Ineffective treatments:  None tried Associated symptoms: fever     History reviewed. No pertinent past medical history.  There are no active problems to display for this patient.   Past Surgical History:  Procedure Laterality Date   KNEE SURGERY Left     OB History   No obstetric history on file.      Home Medications    Prior to Admission medications   Medication Sig Start Date End Date Taking? Authorizing Provider  ondansetron (ZOFRAN) 4 MG tablet Take 1 tablet (4 mg total) by mouth every 6 (six) hours. 04/22/23  Yes Jamall Strohmeier, Linde Gillis, NP  sulfamethoxazole-trimethoprim (BACTRIM DS) 800-160 MG tablet Take 1 tablet by mouth 2 (two) times daily for 7 days. 04/22/23 04/29/23 Yes Asley Baskerville, Linde Gillis, NP    Family History Family History  Problem Relation Age of Onset   Healthy Mother    Healthy Father     Social History Social History   Tobacco Use   Smoking status: Never   Smokeless tobacco: Never  Vaping Use   Vaping status: Never Used  Substance Use Topics   Alcohol use: Yes   Drug use: Not Currently    Types: Marijuana     Allergies   Patient has no known allergies.   Review of Systems Review of Systems  Constitutional:  Positive for appetite change, fatigue and fever.  Gastrointestinal:  Positive for nausea.  Genitourinary:        Suprapubic pain on palpation.  Negative CVA tenderness.   Musculoskeletal:  Positive for back pain.     Physical Exam Triage Vital Signs ED Triage Vitals  Encounter Vitals Group     BP 04/22/23 1612 (!) 147/93     Systolic BP Percentile --      Diastolic BP Percentile --      Pulse Rate 04/22/23 1612 (!) 101     Resp 04/22/23 1612 18     Temp 04/22/23 1612 (!) 102.7 F (39.3 C)     Temp Source 04/22/23 1612 Oral     SpO2 04/22/23 1612 100 %     Weight 04/22/23 1611 260 lb (117.9 kg)     Height 04/22/23 1611 5\' 3"  (1.6 m)     Head Circumference --      Peak Flow --      Pain Score 04/22/23 1610 6     Pain Loc --      Pain Education --      Exclude from Growth Chart --    No data found.  Updated Vital Signs BP (!) 147/93 (BP Location: Right Arm)   Pulse (!) 101   Temp (!) 102.7 F (39.3 C) (Oral)   Resp 18   Ht 5\' 3"  (1.6 m)   Wt 260 lb (117.9 kg)   LMP 04/17/2023 (Exact Date)  SpO2 100%   BMI 46.06 kg/m   Visual Acuity Right Eye Distance:   Left Eye Distance:   Bilateral Distance:    Right Eye Near:   Left Eye Near:    Bilateral Near:     Physical Exam Cardiovascular:     Rate and Rhythm: Normal rate and regular rhythm.  Abdominal:     Palpations: Abdomen is soft.     Comments: Negative CVA tenderness  Genitourinary:    Comments: Suprapubic pain Neurological:     Mental Status: She is alert.      UC Treatments / Results  Labs (all labs ordered are listed, but only abnormal results are displayed) Labs Reviewed  POCT URINALYSIS DIP (MANUAL ENTRY) - Abnormal; Notable for the following components:      Result Value   Blood, UA moderate (*)    Protein Ur, POC =30 (*)    Leukocytes, UA Moderate (2+) (*)    All other components within normal limits  URINE CULTURE  POCT URINE PREGNANCY  POC COVID19/FLU A&B COMBO    EKG   Radiology No results found.  Procedures Procedures (including critical care time)  Medications Ordered in UC Medications  acetaminophen (TYLENOL) tablet 650 mg (650 mg Oral  Given 04/22/23 1619)    Initial Impression / Assessment and Plan / UC Course  I have reviewed the triage vital signs and the nursing notes.  Pertinent labs & imaging results that were available during my care of the patient were reviewed by me and considered in my medical decision making (see chart for details).   Patient's symptoms are suspicious for pyelonephritis/UTI.  Dipstick was positive for leukocytes and blood.  Her COVID and flu was negative and she denied respiratory symptoms.  She has developed worsening of nausea over the last 3 days and has had fever over the last day..  We will treat with Bactrim. She is encouraged to follow-up with primary care or ER for worsening of symptoms.  She is also reporting decreased bowel movements but this may be secondary to decreased appetite.   Final Clinical Impressions(s) / UC Diagnoses   Final diagnoses:  Acute pyelonephritis  Constipation, unspecified constipation type  Nausea without vomiting     Discharge Instructions      Ensure 3 L of fluid.  Antibiotics 1 tab twice a day Zofran for nausea.  Also reported some constipation consider stool softener or MiraLAX.  Ensure you have sufficient  raw veggies or fruits daily. Seek further medical care if symptoms worsen Tylenol or motrin for fever and pain      ED Prescriptions     Medication Sig Dispense Auth. Provider   ondansetron (ZOFRAN) 4 MG tablet Take 1 tablet (4 mg total) by mouth every 6 (six) hours. 12 tablet Renald Haithcock, Linde Gillis, NP   sulfamethoxazole-trimethoprim (BACTRIM DS) 800-160 MG tablet Take 1 tablet by mouth 2 (two) times daily for 7 days. 14 tablet Makye Radle, Linde Gillis, NP      PDMP not reviewed this encounter.   Nelda Marseille, NP 04/22/23 [phone]

## 2023-04-22 NOTE — Discharge Instructions (Addendum)
Ensure 3 L of fluid.  Antibiotics 1 tab twice a day Zofran for nausea.  Also reported some constipation consider stool softener or MiraLAX.  Ensure you have sufficient  raw veggies or fruits daily. Seek further medical care if symptoms worsen Tylenol or motrin for fever and pain

## 2023-04-22 NOTE — ED Triage Notes (Signed)
Pt presents with complaints of left lower back pain, nausea, and chills x 3 days. Pt reports taking Ibuprofen with some improvement. Pt currently denies nausea. Pt states her lower back pain is a 6/10 at this time. Pt denies urinary symptoms.

## 2023-06-02 ENCOUNTER — Ambulatory Visit (HOSPITAL_COMMUNITY)
Admission: EM | Admit: 2023-06-02 | Discharge: 2023-06-02 | Disposition: A | Discharge status: Home / Self Care | Payer: Medicaid Other | Attending: Family Medicine | Admitting: Family Medicine

## 2023-06-02 ENCOUNTER — Encounter (HOSPITAL_COMMUNITY): Payer: Self-pay

## 2023-06-02 DIAGNOSIS — J101 Influenza due to other identified influenza virus with other respiratory manifestations: Secondary | ICD-10-CM

## 2023-06-02 LAB — POC COVID19/FLU A&B COMBO
Covid Antigen, POC: NEGATIVE
Influenza A Antigen, POC: POSITIVE — AB
Influenza B Antigen, POC: NEGATIVE

## 2023-06-02 MED ORDER — OSELTAMIVIR PHOSPHATE 75 MG PO CAPS: 75.0000 mg | ORAL_CAPSULE | Freq: Two times a day (BID) | ORAL | 0 refills | Status: AC

## 2023-06-02 NOTE — ED Provider Notes (Signed)
[facility]    CSN: [id number] Arrival date & time: 06/02/23  4098      History   Chief Complaint Chief Complaint  Patient presents with   Cough    HPI VALMAI VANDENBERGHE is a 28 y.o. female.   The patient is a 28 y.o. female presenting with 2.5 days of congestion, intermittent fever, fatigue, and body aches with cough. She denies sore throat, nausea, vomiting, diarrhea, ABD pain, difficulty breathing, wheezing, chest pain, dizziness or light headedness. She has a decreased appetite and is eating less but still drinking plenty of fluids.   The history is provided by the patient.  Cough Associated symptoms: chills, fever (max 102 yesterday but has resolved), myalgias and rhinorrhea   Associated symptoms: no chest pain, no diaphoresis, no ear pain, no rash, no shortness of breath, no sore throat and no wheezing     History reviewed. No pertinent past medical history.  There are no active problems to display for this patient.   Past Surgical History:  Procedure Laterality Date   KNEE SURGERY Left     OB History   No obstetric history on file.      Home Medications    Prior to Admission medications   Not on File    Family History Family History  Problem Relation Age of Onset   Healthy Mother    Healthy Father     Social History Social History   Tobacco Use   Smoking status: Never   Smokeless tobacco: Never  Vaping Use   Vaping status: Never Used  Substance Use Topics   Alcohol use: Yes   Drug use: Not Currently    Types: Marijuana     Allergies   Patient has no known allergies.   Review of Systems Review of Systems  Constitutional:  Positive for activity change, appetite change, chills, fatigue and fever (max 102 yesterday but has resolved). Negative for diaphoresis.  HENT:  Positive for congestion, postnasal drip and rhinorrhea. Negative for ear pain, mouth sores, sinus pressure, sinus pain, sore throat, tinnitus and trouble  swallowing.   Respiratory:  Positive for cough. Negative for chest tightness, shortness of breath and wheezing.   Cardiovascular:  Negative for chest pain and palpitations.  Gastrointestinal:  Negative for diarrhea, nausea and vomiting.  Musculoskeletal:  Positive for myalgias. Negative for arthralgias, joint swelling, neck pain and neck stiffness.  Skin:  Negative for rash.  Neurological:  Negative for dizziness and light-headedness.     Physical Exam Triage Vital Signs ED Triage Vitals  Encounter Vitals Group     BP 06/02/23 0831 (!) 133/96     Systolic BP Percentile --      Diastolic BP Percentile --      Pulse Rate 06/02/23 0831 82     Resp 06/02/23 0831 16     Temp 06/02/23 0831 99 F (37.2 C)     Temp Source 06/02/23 0831 Oral     SpO2 06/02/23 0831 98 %     Weight 06/02/23 0831 250 lb (113.4 kg)     Height 06/02/23 0831 5\' 3"  (1.6 m)     Head Circumference --      Peak Flow --      Pain Score 06/02/23 0832 4     Pain Loc --      Pain Education --      Exclude from Growth Chart --    No data found.  Updated Vital Signs BP (!) 133/96 (BP  Location: Right Arm)   Pulse 82   Temp 99 F (37.2 C) (Oral)   Resp 16   Ht 5\' 3"  (1.6 m)   Wt 113.4 kg   LMP 05/27/2023 (Exact Date)   SpO2 98%   BMI 44.29 kg/m   Visual Acuity Right Eye Distance:   Left Eye Distance:   Bilateral Distance:    Right Eye Near:   Left Eye Near:    Bilateral Near:     Physical Exam Vitals reviewed.  Constitutional:      General: She is not in acute distress.    Appearance: Normal appearance. She is not ill-appearing, toxic-appearing or diaphoretic.  HENT:     Head: Normocephalic and atraumatic.     Nose: Congestion present. No rhinorrhea.     Mouth/Throat:     Mouth: Mucous membranes are moist.     Pharynx: No oropharyngeal exudate.     Comments: Mild tonsillar edema with clear posterior oropharyngeal drainage No erythema All structures midline Eyes:     General:        Right  eye: No discharge.        Left eye: No discharge.     Extraocular Movements: Extraocular movements intact.     Conjunctiva/sclera: Conjunctivae normal.     Pupils: Pupils are equal, round, and reactive to light.  Cardiovascular:     Rate and Rhythm: Normal rate and regular rhythm.     Pulses: Normal pulses.  Pulmonary:     Effort: Pulmonary effort is normal.     Breath sounds: Normal breath sounds. No wheezing or rales.  Abdominal:     General: Abdomen is flat. Bowel sounds are normal.     Tenderness: There is no abdominal tenderness.  Skin:    General: Skin is warm and dry.     Capillary Refill: Capillary refill takes 2 to 3 seconds.     Findings: No rash.  Neurological:     General: No focal deficit present.     Mental Status: She is alert.      UC Treatments / Results  Labs (all labs ordered are listed, but only abnormal results are displayed) Labs Reviewed  POC COVID19/FLU A&B COMBO - Abnormal; Notable for the following components:      Result Value   Influenza A Antigen, POC Positive (*)    All other components within normal limits    EKG   Radiology No results found.  Procedures Procedures (including critical care time)  Medications Ordered in UC Medications - No data to display  Initial Impression / Assessment and Plan / UC Course  I have reviewed the triage vital signs and the nursing notes.  Pertinent labs & imaging results that were available during my care of the patient were reviewed by me and considered in my medical decision making (see chart for details).     Influenza A, stable - The patient is stable without respiratory sxs.  - Rapid COVID/influenza: positive for influenza A - I discussed symptomatic management including the need for ongoing hydration.  - We discussed the option of treatment with Tamiflu. The patient will start this for a complete course.  - Work note given - The patient voiced understanding and agreement with the  plan.  Final Clinical Impressions(s) / UC Diagnoses   Final diagnoses:  Influenza A     Discharge Instructions      You have the flu Make sure you are getting plenty of water. This allows you to  heal and for medications to work.  One way to check your hydration status is the color of your urine. If it is slightly yellow and mostly clear you are hydrated. If it is dark yellow or brown you are dehydrated.  Ensure you get enough rest  Avoid direct contact with others to avoid spread.  You are within the window for Tamiflu (Oseltamivir) treatment On average the medication can decrease the length of illness by one day but does not decrease symptoms Take the medication to completion. If you start feeling confusion or fever that does not respond to medication return or go to the emergency room. You should stay home from work until at least 24 hrs after starting tamiflu and after your last fever.       ED Prescriptions   None    PDMP not reviewed this encounter.   Ivor Messier, MD 06/02/23 [phone]

## 2023-06-02 NOTE — Discharge Instructions (Addendum)
You have the flu Make sure you are getting plenty of water. This allows you to heal and for medications to work.  One way to check your hydration status is the color of your urine. If it is slightly yellow and mostly clear you are hydrated. If it is dark yellow or brown you are dehydrated.  Ensure you get enough rest  Avoid direct contact with others to avoid spread.  You are within the window for Tamiflu (Oseltamivir) treatment On average the medication can decrease the length of illness by one day but does not decrease symptoms Take the medication to completion. If you start feeling confusion or fever that does not respond to medication return or go to the emergency room. You should stay home from work until at least 24 hrs after starting tamiflu and after your last fever.

## 2023-06-02 NOTE — ED Triage Notes (Signed)
Patient here today with c/o cough, nasal congestion, fever, chills, sweats, and headaches since Monday. She has taken IBU and Theraflu with some relief. No known sick contacts. No recent travel.

## 2023-11-30 ENCOUNTER — Encounter (HOSPITAL_COMMUNITY): Payer: Self-pay

## 2023-11-30 ENCOUNTER — Ambulatory Visit (HOSPITAL_COMMUNITY)
Admission: RE | Admit: 2023-11-30 | Discharge: 2023-11-30 | Disposition: A | Discharge status: Home / Self Care | Payer: Self-pay | Source: Ambulatory Visit | Attending: Emergency Medicine | Admitting: Emergency Medicine

## 2023-11-30 VITALS — BP 134/87 | HR 66 | Temp 97.8°F | Resp 18

## 2023-11-30 DIAGNOSIS — R112 Nausea with vomiting, unspecified: Secondary | ICD-10-CM

## 2023-11-30 DIAGNOSIS — K5900 Constipation, unspecified: Secondary | ICD-10-CM

## 2023-11-30 DIAGNOSIS — Z3202 Encounter for pregnancy test, result negative: Secondary | ICD-10-CM

## 2023-11-30 LAB — POCT URINE PREGNANCY: Preg Test, Ur: NEGATIVE

## 2023-11-30 MED ORDER — POLYETHYLENE GLYCOL 3350 17 G PO PACK: 17.0000 g | PACK | Freq: Every day | ORAL | 0 refills | Status: AC

## 2023-11-30 NOTE — ED Provider Notes (Signed)
 [facility]    CSN: [id number] Arrival date & time: 11/30/23  1127      History   Chief Complaint Chief Complaint  Patient presents with   appt-nausea         HPI Bonnie Reilly is a 28 y.o. female.   Patient presents with nausea and vomiting that began yesterday.  Patient states that she had 2 episodes of vomiting yesterday, and also reports that she had a couple episodes of diarrhea yesterday.  Patient states that she continues to have nausea today but is also having difficulty having a bowel movement as well and is concerned she may not be constipated.    Patient is also requesting a return to work note.  Patient denies any blood in her stool.  Denies fever, body aches, and chills.  Patient does report a history of irregular menstrual cycles, and is unsure of her last menstrual cycle.  The history is provided by the patient and medical records.    History reviewed. No pertinent past medical history.  There are no active problems to display for this patient.   Past Surgical History:  Procedure Laterality Date   KNEE SURGERY Left     OB History   No obstetric history on file.      Home Medications    Prior to Admission medications   Medication Sig Start Date End Date Taking? Authorizing Provider  polyethylene glycol (MIRALAX ) 17 g packet Take 17 g by mouth daily. 11/30/23  Yes Johnie Rumaldo DELENA, NP    Family History Family History  Problem Relation Age of Onset   Healthy Mother    Healthy Father     Social History Social History   Tobacco Use   Smoking status: Never   Smokeless tobacco: Never  Vaping Use   Vaping status: Never Used  Substance Use Topics   Alcohol use: Yes   Drug use: Not Currently    Types: Marijuana     Allergies   Patient has no known allergies.   Review of Systems Review of Systems  Per HPI  Physical Exam Triage Vital Signs ED Triage Vitals [11/30/23 1147]  Encounter Vitals Group     BP 134/87      Girls Systolic BP Percentile      Girls Diastolic BP Percentile      Boys Systolic BP Percentile      Boys Diastolic BP Percentile      Pulse Rate 66     Resp 18     Temp 97.8 F (36.6 C)     Temp Source Oral     SpO2 98 %     Weight      Height      Head Circumference      Peak Flow      Pain Score 0     Pain Loc      Pain Education      Exclude from Growth Chart    No data found.  Updated Vital Signs BP 134/87 (BP Location: Left Arm)   Pulse 66   Temp 97.8 F (36.6 C) (Oral)   Resp 18   SpO2 98%   Visual Acuity Right Eye Distance:   Left Eye Distance:   Bilateral Distance:    Right Eye Near:   Left Eye Near:    Bilateral Near:     Physical Exam Vitals and nursing note reviewed.  Constitutional:      General: She is awake. She is  not in acute distress.    Appearance: Normal appearance. She is well-developed and well-groomed. She is not ill-appearing.  Abdominal:     General: Abdomen is protuberant. Bowel sounds are normal. There is no distension.     Palpations: Abdomen is soft.     Tenderness: There is no abdominal tenderness. There is no guarding or rebound.  Skin:    General: Skin is dry.  Neurological:     Mental Status: She is alert.  Psychiatric:        Behavior: Behavior is cooperative.      UC Treatments / Results  Labs (all labs ordered are listed, but only abnormal results are displayed) Labs Reviewed  POCT URINE PREGNANCY    EKG   Radiology No results found.  Procedures Procedures (including critical care time)  Medications Ordered in UC Medications - No data to display  Initial Impression / Assessment and Plan / UC Course  I have reviewed the triage vital signs and the nursing notes.  Pertinent labs & imaging results that were available during my care of the patient were reviewed by me and considered in my medical decision making (see chart for details).     Patient is overall well-appearing.  Vitals are stable.   Abdomen is protuberant due to excess weight.  Otherwise abdomen is soft and nontender.  Bowel sounds normal.  No signs of acute abdomen.  Urine pregnancy negative.  Prescribed MiraLAX  for constipation.  Discussed importance of hydration and eating foods high in fiber.  Discussed follow-up and return precautions. Final Clinical Impressions(s) / UC Diagnoses   Final diagnoses:  Nausea and vomiting, unspecified vomiting type  Constipation, unspecified constipation type     Discharge Instructions      You can take MiraLAX  twice daily to help with constipation.  Once you start having regular bowel movements you can take this once daily to help continue to soften the stool. Make sure you are drinking lots of water with this for it to be the most effective. Eat foods high in fiber including fruits, vegetables, and whole grains.  Avoid fatty, greasy, and sugary foods as this could be worsening your constipation. Follow-up with your primary care provider or return here as needed.    ED Prescriptions     Medication Sig Dispense Auth. Provider   polyethylene glycol (MIRALAX ) 17 g packet Take 17 g by mouth daily. 14 each Johnie Rumaldo LABOR, NP      PDMP not reviewed this encounter.   Johnie Rumaldo A, NP 11/30/23 1226

## 2023-11-30 NOTE — ED Triage Notes (Signed)
 Pt c/o vomiting x2 yesterday and nausea today. Denies taken any meds. States needs a work note.

## 2023-11-30 NOTE — Discharge Instructions (Signed)
 You can take MiraLAX  twice daily to help with constipation.  Once you start having regular bowel movements you can take this once daily to help continue to soften the stool. Make sure you are drinking lots of water with this for it to be the most effective. Eat foods high in fiber including fruits, vegetables, and whole grains.  Avoid fatty, greasy, and sugary foods as this could be worsening your constipation. Follow-up with your primary care provider or return here as needed.

## 2024-01-17 ENCOUNTER — Other Ambulatory Visit: Payer: Self-pay

## 2024-01-17 ENCOUNTER — Encounter: Payer: Self-pay | Admitting: Emergency Medicine

## 2024-01-17 ENCOUNTER — Ambulatory Visit (INDEPENDENT_AMBULATORY_CARE_PROVIDER_SITE_OTHER)

## 2024-01-17 ENCOUNTER — Ambulatory Visit
Admission: EM | Admit: 2024-01-17 | Discharge: 2024-01-17 | Disposition: A | Discharge status: Home / Self Care | Attending: Family Medicine | Admitting: Family Medicine

## 2024-01-17 DIAGNOSIS — M898X1 Other specified disorders of bone, shoulder: Secondary | ICD-10-CM

## 2024-01-17 NOTE — ED Provider Notes (Signed)
 [facility]    CSN: [id number] Arrival date & time: 01/17/24  1202      History   Chief Complaint Chief Complaint  Patient presents with   Clavicle Injury    HPI Bonnie Reilly is a 28 y.o. female.   Patient is here for left clavicle pain.  This started slightly last night, and worsened today while at work.  No known injury.  She does a lot of heavy lifting while at work and that aggravated it.  She had to leave work early.  She did take medication with help today.        History reviewed. No pertinent past medical history.  There are no active problems to display for this patient.   Past Surgical History:  Procedure Laterality Date   KNEE SURGERY Left     OB History   No obstetric history on file.      Home Medications    Prior to Admission medications   Medication Sig Start Date End Date Taking? Authorizing Provider  polyethylene glycol (MIRALAX ) 17 g packet Take 17 g by mouth daily. 11/30/23   Johnie Rumaldo DELENA, NP    Family History Family History  Problem Relation Age of Onset   Healthy Mother    Healthy Father     Social History Social History   Tobacco Use   Smoking status: Never   Smokeless tobacco: Never  Vaping Use   Vaping status: Never Used  Substance Use Topics   Alcohol use: Yes   Drug use: Not Currently    Types: Marijuana     Allergies   Patient has no known allergies.   Review of Systems Review of Systems  Constitutional: Negative.   HENT: Negative.    Respiratory: Negative.    Cardiovascular: Negative.   Gastrointestinal: Negative.   Musculoskeletal:  Positive for myalgias.     Physical Exam Triage Vital Signs ED Triage Vitals [01/17/24 1216]  Encounter Vitals Group     BP (!) 151/97     Girls Systolic BP Percentile      Girls Diastolic BP Percentile      Boys Systolic BP Percentile      Boys Diastolic BP Percentile      Pulse Rate 68     Resp 18     Temp 98.1 F (36.7 C)     Temp  Source Oral     SpO2 98 %     Weight      Height      Head Circumference      Peak Flow      Pain Score 4     Pain Loc      Pain Education      Exclude from Growth Chart    No data found.  Updated Vital Signs BP (!) 151/97 (BP Location: Right Arm)   Pulse 68   Temp 98.1 F (36.7 C) (Oral)   Resp 18   SpO2 98%   Visual Acuity Right Eye Distance:   Left Eye Distance:   Bilateral Distance:    Right Eye Near:   Left Eye Near:    Bilateral Near:     Physical Exam Constitutional:      Appearance: Normal appearance. She is normal weight.  Musculoskeletal:     Comments: TTP to the left clavicle, and left upper rib area.  Full rom of the neck and shoulder with slight pain to the left upper chest.   Neurological:  Mental Status: She is alert.      UC Treatments / Results  Labs (all labs ordered are listed, but only abnormal results are displayed) Labs Reviewed - No data to display  EKG   Radiology No results found.  Procedures Procedures (including critical care time)  Medications Ordered in UC Medications - No data to display  Initial Impression / Assessment and Plan / UC Course  I have reviewed the triage vital signs and the nursing notes.  Pertinent labs & imaging results that were available during my care of the patient were reviewed by me and considered in my medical decision making (see chart for details).  Final Clinical Impressions(s) / UC Diagnoses   Final diagnoses:  Clavicle pain     Discharge Instructions      You were seen today for clavicle pain.  Your xray appears normal.  This is likely just a muscular strain.  I recommend you use heat/ice, and motrin  for pain.  I have given you time off of work to rest.  Please return if not improving.     ED Prescriptions   None    PDMP not reviewed this encounter.   Darral Longs, MD 01/17/24 [phone]

## 2024-01-17 NOTE — ED Triage Notes (Signed)
 Pt here for left sided clavicle area pain x 2 days worse with movement, picking up heavy things at work and palpation; pt sts had to leave work early today due to pain

## 2024-01-17 NOTE — Discharge Instructions (Signed)
 You were seen today for clavicle pain.  Your xray appears normal.  This is likely just a muscular strain.  I recommend you use heat/ice, and motrin  for pain.  I have given you time off of work to rest.  Please return if not improving.
